# Patient Record
Sex: Female | Born: 1995 | Race: White | Hispanic: No | Marital: Single | Smoking: Former smoker
Health system: Southern US, Community
[De-identification: ages and names within clinical notes are randomized; demographics above are authoritative.]

## PROBLEM LIST (undated history)

## (undated) DIAGNOSIS — F909 Attention-deficit hyperactivity disorder, unspecified type: Secondary | ICD-10-CM

## (undated) DIAGNOSIS — Q600 Renal agenesis, unilateral: Secondary | ICD-10-CM

## (undated) DIAGNOSIS — N289 Disorder of kidney and ureter, unspecified: Secondary | ICD-10-CM

## (undated) DIAGNOSIS — E079 Disorder of thyroid, unspecified: Secondary | ICD-10-CM

## (undated) HISTORY — DX: Renal agenesis, unilateral: Q60.0

## (undated) HISTORY — DX: Attention-deficit hyperactivity disorder, unspecified type: F90.9

---
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
## 2014-04-01 ENCOUNTER — Emergency Department (HOSPITAL_COMMUNITY): Payer: Medicaid Other

## 2014-04-01 ENCOUNTER — Emergency Department (HOSPITAL_COMMUNITY)
Admission: EM | Admit: 2014-04-01 | Discharge: 2014-04-01 | Disposition: A | Discharge status: Home / Self Care | Payer: Medicaid Other | Attending: Emergency Medicine | Admitting: Emergency Medicine

## 2014-04-01 ENCOUNTER — Encounter (HOSPITAL_COMMUNITY): Payer: Self-pay | Admitting: Emergency Medicine

## 2014-04-01 DIAGNOSIS — R1011 Right upper quadrant pain: Secondary | ICD-10-CM | POA: Diagnosis not present

## 2014-04-01 DIAGNOSIS — R109 Unspecified abdominal pain: Secondary | ICD-10-CM | POA: Insufficient documentation

## 2014-04-01 DIAGNOSIS — Z8659 Personal history of other mental and behavioral disorders: Secondary | ICD-10-CM | POA: Diagnosis not present

## 2014-04-01 DIAGNOSIS — N390 Urinary tract infection, site not specified: Secondary | ICD-10-CM | POA: Insufficient documentation

## 2014-04-01 DIAGNOSIS — Z3202 Encounter for pregnancy test, result negative: Secondary | ICD-10-CM | POA: Diagnosis not present

## 2014-04-01 LAB — CBC WITH DIFFERENTIAL/PLATELET
BASOS PCT: 0 % (ref 0–1)
Basophils Absolute: 0 10*3/uL (ref 0.0–0.1)
Eosinophils Absolute: 0.5 10*3/uL (ref 0.0–0.7)
Eosinophils Relative: 5 % (ref 0–5)
HEMATOCRIT: 35.5 % — AB (ref 36.0–46.0)
HEMOGLOBIN: 11.9 g/dL — AB (ref 12.0–15.0)
Lymphocytes Relative: 21 % (ref 12–46)
Lymphs Abs: 1.9 10*3/uL (ref 0.7–4.0)
MCH: 30 pg (ref 26.0–34.0)
MCHC: 33.5 g/dL (ref 30.0–36.0)
MCV: 89.4 fL (ref 78.0–100.0)
MONO ABS: 0.4 10*3/uL (ref 0.1–1.0)
MONOS PCT: 5 % (ref 3–12)
NEUTROS ABS: 6.2 10*3/uL (ref 1.7–7.7)
Neutrophils Relative %: 69 % (ref 43–77)
Platelets: 387 10*3/uL (ref 150–400)
RBC: 3.97 MIL/uL (ref 3.87–5.11)
RDW: 12.9 % (ref 11.5–15.5)
WBC: 8.9 10*3/uL (ref 4.0–10.5)

## 2014-04-01 LAB — URINALYSIS, ROUTINE W REFLEX MICROSCOPIC
Glucose, UA: NEGATIVE mg/dL
Hgb urine dipstick: NEGATIVE
NITRITE: NEGATIVE
PH: 5.5 (ref 5.0–8.0)
PROTEIN: 30 mg/dL — AB
Specific Gravity, Urine: 1.025 (ref 1.005–1.030)
Urobilinogen, UA: 1 mg/dL (ref 0.0–1.0)

## 2014-04-01 LAB — COMPREHENSIVE METABOLIC PANEL
ALBUMIN: 3.5 g/dL (ref 3.5–5.2)
ALT: 17 U/L (ref 0–35)
ANION GAP: 10 (ref 5–15)
AST: 19 U/L (ref 0–37)
Alkaline Phosphatase: 103 U/L (ref 39–117)
BILIRUBIN TOTAL: 0.2 mg/dL — AB (ref 0.3–1.2)
BUN: 13 mg/dL (ref 6–23)
CHLORIDE: 103 meq/L (ref 96–112)
CO2: 27 mEq/L (ref 19–32)
CREATININE: 0.82 mg/dL (ref 0.50–1.10)
Calcium: 9.1 mg/dL (ref 8.4–10.5)
GLUCOSE: 82 mg/dL (ref 70–99)
Potassium: 4 mEq/L (ref 3.7–5.3)
Sodium: 140 mEq/L (ref 137–147)
Total Protein: 7.3 g/dL (ref 6.0–8.3)

## 2014-04-01 LAB — D-DIMER, QUANTITATIVE: D-Dimer, Quant: 3.25 ug/mL-FEU — ABNORMAL HIGH (ref 0.00–0.48)

## 2014-04-01 LAB — PREGNANCY, URINE: Preg Test, Ur: NEGATIVE

## 2014-04-01 LAB — URINE MICROSCOPIC-ADD ON

## 2014-04-01 LAB — LIPASE, BLOOD: Lipase: 16 U/L (ref 11–59)

## 2014-04-01 MED ORDER — HYDROCODONE-ACETAMINOPHEN 5-325 MG PO TABS: 1.0000 | ORAL_TABLET | Freq: Four times a day (QID) | ORAL | Status: DC | PRN

## 2014-04-01 MED ORDER — CEPHALEXIN 500 MG PO CAPS: 500.0000 mg | ORAL_CAPSULE | Freq: Four times a day (QID) | ORAL | Status: DC

## 2014-04-01 MED ORDER — ONDANSETRON HCL 4 MG/2ML IJ SOLN
4.0000 mg | Freq: Once | INTRAMUSCULAR | Status: AC
  Administered 2014-04-01: 4 mg via INTRAVENOUS
  Filled 2014-04-01: qty 2

## 2014-04-01 MED ORDER — RANITIDINE HCL 150 MG PO CAPS: 150.0000 mg | ORAL_CAPSULE | Freq: Every day | ORAL | Status: DC

## 2014-04-01 MED ORDER — HYDROMORPHONE HCL PF 1 MG/ML IJ SOLN
1.0000 mg | Freq: Once | INTRAMUSCULAR | Status: AC
  Administered 2014-04-01: 1 mg via INTRAVENOUS
  Filled 2014-04-01: qty 1

## 2014-04-01 MED ORDER — IOHEXOL 350 MG/ML SOLN
100.0000 mL | Freq: Once | INTRAVENOUS | Status: AC | PRN
  Administered 2014-04-01: 100 mL via INTRAVENOUS

## 2014-04-01 MED ORDER — CEPHALEXIN 500 MG PO CAPS
500.0000 mg | ORAL_CAPSULE | Freq: Once | ORAL | Status: AC
  Administered 2014-04-01: 500 mg via ORAL
  Filled 2014-04-01: qty 1

## 2014-04-01 NOTE — ED Notes (Addendum)
Pain rt flank. Up to rt shoulder , post thighs hurt also.  No NVD, No fever.  No cough  No urinary sx.  "hurts to breathe"  Pt is 1 month post partum. Nl vaginal delivery.

## 2014-04-01 NOTE — ED Notes (Signed)
MD at bedside. 

## 2014-04-01 NOTE — Discharge Instructions (Signed)
Follow up with your md next week for recheck °

## 2014-04-01 NOTE — ED Provider Notes (Signed)
Pt left at change of shift to get ddimer result. It is elevated. Talked to patient about need for CT angio chest.   PT given results of her CT, pt was discharged as planned by Dr Estell Harpin.    Ct Angio Chest W/cm &/or Wo Cm  04/01/2014   CLINICAL DATA:  Right side chest pain, positive D-dimer  EXAM: CT ANGIOGRAPHY CHEST WITH CONTRAST  TECHNIQUE: Multidetector CT imaging of the chest was performed using the standard protocol during bolus administration of intravenous contrast. Multiplanar CT image reconstructions and MIPs were obtained to evaluate the vascular anatomy.  CONTRAST:  OMNIPAQUE IOHEXOL 350 MG/ML SOLN  COMPARISON:  None.  FINDINGS: Sagittal images of the spine are unremarkable. Images of the thoracic inlet are unremarkable. Central airways are patent. No mediastinal hematoma or adenopathy. No aortic aneurysm. No pulmonary embolus is identified. No destructive bony lesions are noted.  Images of the lung parenchyma shows no acute infiltrate or pleural effusion. No pulmonary edema. No pulmonary nodules are noted. There is no pneumothorax.  Visualized upper abdomen is unremarkable.  No axillary adenopathy.  Review of the MIP images confirms the above findings.  IMPRESSION: 1. No pulmonary embolus. 2. No acute infiltrate or pulmonary edema. 3. No mediastinal hematoma or adenopathy.   Electronically Signed   By: Natasha Mead M.D.   On: 04/01/2014 16:49   Plan discharge  Devoria Albe, MD, Franz Dell, MD 04/01/14 [phone]

## 2014-04-01 NOTE — ED Provider Notes (Signed)
CSN: [id number]     Arrival date & time 04/01/14  1348 History  This chart was scribed for Benny Lennert, MD by Carl Best, ED Scribe. This patient was seen in room APA04/APA04 and the patient's care was started at 2:15 PM.    Chief Complaint  Patient presents with  . Flank Pain    Patient is a 18 y.o. female presenting with abdominal pain. The history is provided by the patient. No language interpreter was used.  Abdominal Pain Pain location:  L flank Pain radiates to:  L leg Pain severity:  Moderate Onset quality:  Sudden Duration:  2 days Progression:  Worsening Chronicity:  New Relieved by:  None tried Worsened by:  Nothing tried Ineffective treatments:  None tried Associated symptoms: no chest pain, no cough, no diarrhea, no fatigue and no hematuria   Risk factors: has not had multiple surgeries     Past Medical History  Diagnosis Date  . ADHD (attention deficit hyperactivity disorder) 11/05/2012  . Renal disorder     born with one kidney    History reviewed. No pertinent past surgical history. Family History  Problem Relation Age of Onset  . Hyperlipidemia Father    History  Substance Use Topics  . Smoking status: Former Smoker    Types: Cigarettes  . Smokeless tobacco: Never Used  . Alcohol Use: No   OB History   Grav Para Term Preterm Abortions TAB SAB Ect Mult Living   1              Review of Systems  Constitutional: Negative for appetite change and fatigue.  HENT: Negative for congestion, ear discharge and sinus pressure.   Eyes: Negative for discharge.  Respiratory: Negative for cough.   Cardiovascular: Negative for chest pain.  Gastrointestinal: Negative for abdominal pain and diarrhea.  Genitourinary: Positive for flank pain. Negative for frequency and hematuria.  Musculoskeletal: Positive for arthralgias. Negative for back pain.  Skin: Negative for rash.  Neurological: Negative for seizures and headaches.  Psychiatric/Behavioral: Negative for  hallucinations.      Allergies  Review of patient's allergies indicates no known allergies.  Home Medications   Prior to Admission medications   Medication Sig Start Date End Date Taking? Authorizing Provider  Doxylamine-Pyridoxine (DICLEGIS) 10-10 MG TBEC Take 10 mg by mouth See admin instructions. 07/09/13   Marge Duncans, CNM  metroNIDAZOLE (FLAGYL) 500 MG tablet Take 1 tablet (500 mg total) by mouth 2 (two) times daily. 06/18/13   Jacklyn Shell, CNM  ondansetron (ZOFRAN ODT) 4 MG disintegrating tablet Take 1 tablet (4 mg total) by mouth every 8 (eight) hours as needed for nausea or vomiting. 08/31/13   Ward Givens, MD  oseltamivir (TAMIFLU) 75 MG capsule Take 1 capsule (75 mg total) by mouth every 12 (twelve) hours. 08/31/13   Ward Givens, MD  prenatal vitamin w/FE, FA (PRENATAL 1 + 1) 27-1 MG TABS tablet Take 1 tablet by mouth daily at 12 noon. 07/09/13   Marge Duncans, CNM   Triage Vitals: BP 125/87  Pulse 75  Temp(Src) 98.1 F (36.7 C) (Oral)  Resp 18  Ht 5' (1.524 m)  Wt 165 lb (74.844 kg)  BMI 32.22 kg/m2  SpO2 99%  LMP 03/17/2013  Breastfeeding? Unknown  Physical Exam  Constitutional: She is oriented to person, place, and time. She appears well-developed.  HENT:  Head: Normocephalic.  Eyes: Conjunctivae and EOM are normal. No scleral icterus.  Neck: Neck supple. No thyromegaly present.  Cardiovascular: Normal rate and regular rhythm.  Exam reveals no gallop and no friction rub.   No murmur heard. Pulmonary/Chest: No stridor. She has no wheezes. She has no rales. She exhibits no tenderness.  Abdominal: She exhibits no distension. There is tenderness in the right upper quadrant. There is no rebound.  Musculoskeletal: Normal range of motion. She exhibits no edema.  Lymphadenopathy:    She has no cervical adenopathy.  Neurological: She is oriented to person, place, and time. She exhibits normal muscle tone. Coordination normal.  Skin: No  rash noted. No erythema.  Psychiatric: She has a normal mood and affect. Her behavior is normal.    ED Course  Procedures (including critical care time)  DIAGNOSTIC STUDIES: Oxygen Saturation is 99% on room air, normal by my interpretation.    COORDINATION OF CARE: 2:17 PM- Discussed getting an ultrasound of the patient's gallbladder and the patient agreed to the treatment plan.   Labs Review Labs Reviewed - No data to display  Imaging Review No results found.   EKG Interpretation None      MDM   Final diagnoses:  None    The chart was scribed for me under my direct supervision.  I personally performed the history, physical, and medical decision making and all procedures in the evaluation of this patient.Benny Lennert, MD 04/14/14 [phone]

## 2014-06-02 ENCOUNTER — Encounter (HOSPITAL_COMMUNITY): Payer: Self-pay | Admitting: Emergency Medicine

## 2014-09-04 ENCOUNTER — Ambulatory Visit: Payer: Medicaid Other | Admitting: Pediatrics

## 2014-11-11 ENCOUNTER — Encounter: Payer: Medicaid Other | Admitting: Pediatrics

## 2014-11-13 ENCOUNTER — Ambulatory Visit (HOSPITAL_COMMUNITY): Payer: MEDICAID | Admitting: Psychiatry

## 2014-11-13 ENCOUNTER — Encounter (HOSPITAL_COMMUNITY): Payer: Self-pay | Admitting: Psychiatry

## 2014-11-14 ENCOUNTER — Ambulatory Visit (HOSPITAL_COMMUNITY): Payer: MEDICAID | Admitting: Psychiatry

## 2014-11-27 ENCOUNTER — Ambulatory Visit (INDEPENDENT_AMBULATORY_CARE_PROVIDER_SITE_OTHER): Payer: MEDICAID | Admitting: Psychiatry

## 2014-11-27 ENCOUNTER — Encounter (HOSPITAL_COMMUNITY): Payer: Self-pay | Admitting: Psychiatry

## 2014-11-27 VITALS — BP 105/62 | HR 92 | Ht 60.0 in | Wt 190.0 lb

## 2014-11-27 DIAGNOSIS — F902 Attention-deficit hyperactivity disorder, combined type: Secondary | ICD-10-CM

## 2014-11-27 MED ORDER — METHYLPHENIDATE HCL ER (OSM) 36 MG PO TBCR: 72.0000 mg | EXTENDED_RELEASE_TABLET | ORAL | Status: DC

## 2014-11-27 NOTE — Progress Notes (Signed)
Psychiatric Assessment Adult  Patient Identification:  Sherry Vasquez Date of Evaluation:  11/27/2014 Chief Complaint: "I have ADHD and I've been off my medicine" History of Chief Complaint:   Chief Complaint  Patient presents with  . ADHD  . Establish Care    HPI this patient is a 19 year old married white female who lives with her husband and 6856-month-old [place] in [place]. She works at a [employer].  The patient is self-referred but she had been going to try of medicine and pediatrics in the past for treatment of ADHD.  The patient states that she was diagnosed with ADHD at age 89. She was mildly hyperactive but primarily unfocused and can follow through on things and didn't complete school work. She was on Adderall initially which was helpful throughout elementary school and middle school. In the ninth grade she did try to do without medicine but was hypertalkative impulsive and often got in trouble with teachers and was suspended several times. In the 10th grade she went back on Concerta and was up to a dosage of 72 mg every morning. She continued on this dose throughout high school and did very well. However the end of high school she got pregnant and got married and went off the medication.  The patient would like to get back on medicine because now she can still can't focus. She starts one thing and doesn't finish and goes to another. She's very disorganized and fidgety. She sleeps well and her energy is good. She denies symptoms of depression or anxiety. She has not have any significant medical problems other than she was congenitally born with one kidney. She does not use drugs or alcohol Review of Systems  Psychiatric/Behavioral: Positive for decreased concentration. The patient is hyperactive.   All other systems reviewed and are negative.  Physical Exam not done  Depressive Symptoms: difficulty concentrating,  (Hypo) Manic Symptoms:   Elevated Mood:  No Irritable Mood:   Yes Grandiosity:  No Distractibility:  Yes Labiality of Mood:  No Delusions:  No Hallucinations:  No Impulsivity:  No Sexually Inappropriate Behavior:  No Financial Extravagance:  No Flight of Ideas:  No  Anxiety Symptoms: Excessive Worry:  No Panic Symptoms:  No Agoraphobia:  No Obsessive Compulsive: No  Symptoms: None, Specific Phobias:  No Social Anxiety:  No  Psychotic Symptoms:  Hallucinations: No None Delusions:  No Paranoia:  No   Ideas of Reference:  No  PTSD Symptoms: Ever had a traumatic exposure:  No Had a traumatic exposure in the last month:  No Re-experiencing: No None Hypervigilance:  No Hyperarousal: No None Avoidance: No None  Traumatic Brain Injury: No  Past Psychiatric History: Diagnosis: ADHD   Hospitalizations: none  Outpatient Care: Through pediatrics   Substance Abuse Care: none  Self-Mutilation: none  Suicidal Attempts: none  Violent Behaviors: none   Past Medical History:   Past Medical History  Diagnosis Date  . ADHD (attention deficit hyperactivity disorder) 11/05/2012  . Renal disorder     born with one kidney   . Congenital absence of one kidney    History of Loss of Consciousness:  No Seizure History:  No Cardiac History:  No Allergies:  No Known Allergies Current Medications:  Current Outpatient Prescriptions  Medication Sig Dispense Refill  . Norgestim-Eth Estrad Triphasic (NORGESTIMATE-ETHINYL ESTRADIOL PO) Take 1 tablet by mouth daily. 0.25mg /0.035mg     . methylphenidate (CONCERTA) 36 MG PO CR tablet Take 2 tablets (72 mg total) by mouth every morning. 60 tablet  0  . methylphenidate (CONCERTA) 36 MG PO CR tablet Take 2 tablets (72 mg total) by mouth every morning. 60 tablet 0   No current facility-administered medications for this visit.    Previous Psychotropic Medications:  Medication Dose   Adderall, Concerta                        Substance Abuse History in the last 12 months: Substance Age of 1st Use  Last Use Amount Specific Type  Nicotine      Alcohol      Cannabis      Opiates      Cocaine      Methamphetamines      LSD      Ecstasy      Benzodiazepines      Caffeine      Inhalants      Others:                          Medical Consequences of Substance Abuse: none  Legal Consequences of Substance Abuse: none  Family Consequences of Substance Abuse: none  Blackouts:  No DT's:  No Withdrawal Symptoms:  No None  Social History: Current Place of Residence: [address] of Birth: [place] Family Members: Husband and daughter, 2 brothers 2 sisters and father Marital Status:  Married Children:   Sons:   Daughters: 1 Relationships:  Education:  HS Print production planner Problems/Performance: Did not do well in high school off medications Religious Beliefs/Practices: Christian History of Abuse: none Armed forces technical officer; Surveyor, quantity History:  None. Legal History: none Hobbies/Interests: Spending time with husband and daughter  Family History:   Family History  Problem Relation Age of Onset  . Hyperlipidemia Father   . ADD / ADHD Brother   . ADD / ADHD Sister   . ADD / ADHD Cousin   . Drug abuse Mother     Mental Status Examination/Evaluation: Objective:  Appearance: Casual and Fairly Groomed  Eye Contact::  Good  Speech:  Clear and Coherent  Volume:  Normal  Mood:  Good   Affect:  Congruent  Thought Process:  Goal Directed  Orientation:  Full (Time, Place, and Person)  Thought Content:  WDL  Suicidal Thoughts:  No  Homicidal Thoughts:  No  Judgement:  Good  Insight:  Good  Psychomotor Activity:  Normal  Akathisia:  No  Handed:  Right  AIMS (if indicated):    Assets:  Communication Skills Desire for Improvement Physical Health Resilience Social Support Talents/Skills    Laboratory/X-Ray Psychological Evaluation(s)   Last basic metabolic panel is normal      Assessment:  Axis I: ADHD, combined type  AXIS I ADHD,  combined type  AXIS II Deferred  AXIS III Past Medical History  Diagnosis Date  . ADHD (attention deficit hyperactivity disorder) 11/05/2012  . Renal disorder     born with one kidney   . Congenital absence of one kidney      AXIS IV other psychosocial or environmental problems  AXIS V 51-60 moderate symptoms   Treatment Plan/Recommendations:  Plan of Care: Medication management   Laboratory:  Psychotherapy: Not needed at this time   Medications: She'll restart Concerta 72 mg every morning   Routine PRN Medications:  No  Consultations:   Safety Concerns:  She denies thoughts of hurting self or others   Other: She'll return in 2 months      ROSS,  Gavin Pound, MD 4/28/20161:21 PM

## 2015-01-05 ENCOUNTER — Encounter (HOSPITAL_COMMUNITY): Payer: Self-pay | Admitting: *Deleted

## 2015-01-05 ENCOUNTER — Emergency Department (HOSPITAL_COMMUNITY)
Admission: EM | Admit: 2015-01-05 | Discharge: 2015-01-06 | Disposition: A | Discharge status: Home / Self Care | Payer: Medicaid Other | Attending: Emergency Medicine | Admitting: Emergency Medicine

## 2015-01-05 DIAGNOSIS — T148XXA Other injury of unspecified body region, initial encounter: Secondary | ICD-10-CM

## 2015-01-05 DIAGNOSIS — Q600 Renal agenesis, unilateral: Secondary | ICD-10-CM | POA: Insufficient documentation

## 2015-01-05 DIAGNOSIS — Z87891 Personal history of nicotine dependence: Secondary | ICD-10-CM | POA: Insufficient documentation

## 2015-01-05 DIAGNOSIS — Y929 Unspecified place or not applicable: Secondary | ICD-10-CM | POA: Diagnosis not present

## 2015-01-05 DIAGNOSIS — S39011A Strain of muscle, fascia and tendon of abdomen, initial encounter: Secondary | ICD-10-CM | POA: Insufficient documentation

## 2015-01-05 DIAGNOSIS — X58XXXA Exposure to other specified factors, initial encounter: Secondary | ICD-10-CM | POA: Diagnosis not present

## 2015-01-05 DIAGNOSIS — Z79899 Other long term (current) drug therapy: Secondary | ICD-10-CM | POA: Diagnosis not present

## 2015-01-05 DIAGNOSIS — F909 Attention-deficit hyperactivity disorder, unspecified type: Secondary | ICD-10-CM | POA: Insufficient documentation

## 2015-01-05 DIAGNOSIS — R1011 Right upper quadrant pain: Secondary | ICD-10-CM | POA: Diagnosis present

## 2015-01-05 DIAGNOSIS — Y939 Activity, unspecified: Secondary | ICD-10-CM | POA: Diagnosis not present

## 2015-01-05 DIAGNOSIS — Z793 Long term (current) use of hormonal contraceptives: Secondary | ICD-10-CM | POA: Diagnosis not present

## 2015-01-05 DIAGNOSIS — Z3202 Encounter for pregnancy test, result negative: Secondary | ICD-10-CM | POA: Insufficient documentation

## 2015-01-05 DIAGNOSIS — Y999 Unspecified external cause status: Secondary | ICD-10-CM | POA: Insufficient documentation

## 2015-01-05 LAB — COMPREHENSIVE METABOLIC PANEL
ALT: 19 U/L (ref 14–54)
ANION GAP: 10 (ref 5–15)
AST: 17 U/L (ref 15–41)
Albumin: 3.9 g/dL (ref 3.5–5.0)
Alkaline Phosphatase: 108 U/L (ref 38–126)
BILIRUBIN TOTAL: 0.2 mg/dL — AB (ref 0.3–1.2)
BUN: 10 mg/dL (ref 6–20)
CHLORIDE: 102 mmol/L (ref 101–111)
CO2: 28 mmol/L (ref 22–32)
Calcium: 9.3 mg/dL (ref 8.9–10.3)
Creatinine, Ser: 0.88 mg/dL (ref 0.44–1.00)
GFR calc non Af Amer: >60 mL/min (ref >60)
Glucose, Bld: 79 mg/dL (ref 65–99)
POTASSIUM: 3.4 mmol/L — AB (ref 3.5–5.1)
Sodium: 140 mmol/L (ref 135–145)
TOTAL PROTEIN: 8 g/dL (ref 6.5–8.1)

## 2015-01-05 LAB — CBC WITH DIFFERENTIAL/PLATELET
Basophils Absolute: 0.1 10*3/uL (ref 0.0–0.1)
Basophils Relative: 1 % (ref 0–1)
EOS ABS: 1 10*3/uL — AB (ref 0.0–0.7)
EOS PCT: 10 % — AB (ref 0–5)
HCT: 41.3 % (ref 36.0–46.0)
HEMOGLOBIN: 13.8 g/dL (ref 12.0–15.0)
LYMPHS PCT: 37 % (ref 12–46)
Lymphs Abs: 3.7 10*3/uL (ref 0.7–4.0)
MCH: 29.6 pg (ref 26.0–34.0)
MCHC: 33.4 g/dL (ref 30.0–36.0)
MCV: 88.4 fL (ref 78.0–100.0)
MONOS PCT: 6 % (ref 3–12)
Monocytes Absolute: 0.6 10*3/uL (ref 0.1–1.0)
NEUTROS ABS: 4.7 10*3/uL (ref 1.7–7.7)
Neutrophils Relative %: 46 % (ref 43–77)
Platelets: 375 10*3/uL (ref 150–400)
RBC: 4.67 MIL/uL (ref 3.87–5.11)
RDW: 13 % (ref 11.5–15.5)
WBC: 10 10*3/uL (ref 4.0–10.5)

## 2015-01-05 LAB — LIPASE, BLOOD: Lipase: 16 U/L — ABNORMAL LOW (ref 22–51)

## 2015-01-05 MED ORDER — FENTANYL CITRATE (PF) 100 MCG/2ML IJ SOLN
50.0000 ug | Freq: Once | INTRAMUSCULAR | Status: AC
  Administered 2015-01-05: 50 ug via INTRAVENOUS
  Filled 2015-01-05: qty 2

## 2015-01-05 MED ORDER — ONDANSETRON HCL 4 MG/2ML IJ SOLN
4.0000 mg | Freq: Once | INTRAMUSCULAR | Status: AC
  Administered 2015-01-05: 4 mg via INTRAVENOUS
  Filled 2015-01-05: qty 2

## 2015-01-05 NOTE — ED Notes (Signed)
Pt c/o right sided pain that started earlier today; pt denies any n/v or any difficulty with urination; last BM was yesterday

## 2015-01-05 NOTE — ED Provider Notes (Signed)
CSN: [id number]     Arrival date & time 01/05/15  2200 History   First MD Initiated Contact with Patient 01/05/15 2227     Chief Complaint  Patient presents with  . Abdominal Pain     (Consider location/radiation/quality/duration/timing/severity/associated sxs/prior Treatment) Patient is a 19 y.o. female presenting with abdominal pain. The history is provided by the patient.  Abdominal Pain Pain location:  RUQ Pain quality: stabbing   Pain radiates to:  Does not radiate Pain severity:  Moderate Duration:  9 hours Timing:  Intermittent Progression:  Worsening Chronicity:  New Context comment:  Pain started after moving objects into appartment. Relieved by:  Nothing Exacerbated by: lying flat or raising arms. Ineffective treatments:  None tried Associated symptoms: no chills, no dysuria, no fever, no nausea, no shortness of breath, no vaginal discharge and no vomiting   Risk factors: no alcohol abuse     Past Medical History  Diagnosis Date  . ADHD (attention deficit hyperactivity disorder) 11/05/2012  . Renal disorder     born with one kidney   . Congenital absence of one kidney    History reviewed. No pertinent past surgical history. Family History  Problem Relation Age of Onset  . Hyperlipidemia Father   . ADD / ADHD Brother   . ADD / ADHD Sister   . ADD / ADHD Cousin   . Drug abuse Mother    History  Substance Use Topics  . Smoking status: Former Smoker    Types: Cigarettes  . Smokeless tobacco: Never Used  . Alcohol Use: No   OB History    Gravida Para Term Preterm AB TAB SAB Ectopic Multiple Living   1              Review of Systems  Constitutional: Negative for fever and chills.  Respiratory: Negative for shortness of breath.   Gastrointestinal: Positive for abdominal pain. Negative for nausea and vomiting.  Genitourinary: Negative for dysuria and vaginal discharge.  All other systems reviewed and are negative.     Allergies  Review of patient's  allergies indicates no known allergies.  Home Medications   Prior to Admission medications   Medication Sig Start Date End Date Taking? Authorizing Provider  methylphenidate (CONCERTA) 36 MG PO CR tablet Take 2 tablets (72 mg total) by mouth every morning. 11/27/14 11/27/15  Myrlene Brokereborah R Ross, MD  methylphenidate (CONCERTA) 36 MG PO CR tablet Take 2 tablets (72 mg total) by mouth every morning. 11/27/14 11/27/15  Myrlene Brokereborah R Ross, MD  Norgestim-Eth Charlott HollerEstrad Triphasic (NORGESTIMATE-ETHINYL ESTRADIOL PO) Take 1 tablet by mouth daily. 0.25mg /0.035mg     Historical Provider, MD   BP 120/80 mmHg  Pulse 93  Temp(Src) 98.8 F (37.1 C) (Oral)  Resp 20  Ht 5' (1.524 m)  Wt 165 lb (74.844 kg)  BMI 32.22 kg/m2  SpO2 100%  LMP 01/01/2015 Physical Exam  Constitutional: She is oriented to person, place, and time. She appears well-developed and well-nourished.  Non-toxic appearance.  HENT:  Head: Normocephalic.  Right Ear: Tympanic membrane and external ear normal.  Left Ear: Tympanic membrane and external ear normal.  Eyes: EOM and lids are normal. Pupils are equal, round, and reactive to light.  Neck: Normal range of motion. Neck supple. Carotid bruit is not present.  Cardiovascular: Normal rate, regular rhythm, normal heart sounds, intact distal pulses and normal pulses.   Pulmonary/Chest: Breath sounds normal. No respiratory distress.  Abdominal: Soft. Bowel sounds are normal. There is no tenderness. There is no  guarding.  No CVAT  Musculoskeletal: Normal range of motion. She exhibits no edema or tenderness.  Lymphadenopathy:       Head (right side): No submandibular adenopathy present.       Head (left side): No submandibular adenopathy present.    She has no cervical adenopathy.  Neurological: She is alert and oriented to person, place, and time. She has normal strength. No cranial nerve deficit or sensory deficit.  Skin: Skin is warm and dry.  Psychiatric: She has a normal mood and affect. Her  speech is normal.  Nursing note and vitals reviewed.   ED Course  Procedures (including critical care time) Labs Review Labs Reviewed  CBC WITH DIFFERENTIAL/PLATELET - Abnormal; Notable for the following:    Eosinophils Relative 10 (*)    Eosinophils Absolute 1.0 (*)    All other components within normal limits  COMPREHENSIVE METABOLIC PANEL  LIPASE, BLOOD  URINALYSIS, ROUTINE W REFLEX MICROSCOPIC (NOT AT [facility])  PREGNANCY, URINE    Imaging Review No results found.   EKG Interpretation None      MDM  Vital signs wnl. Pulse ox 100% on room air. Urine non-acute  cmet wnl. Lipase low at 16. CBC wnl.  Exam is c/w muscle strain. Rx for robaxin and motrin given to the patient. Pt to follow up with PCP for additional evaluation and management.   Final diagnoses:  None    *I have reviewed nursing notes, vital signs, and all appropriate lab and imaging results for this patient.**    Ivery Quale, PA-C 01/09/15 0147  Samuel Jester, DO 01/10/15 1139

## 2015-01-06 LAB — URINALYSIS, ROUTINE W REFLEX MICROSCOPIC
BILIRUBIN URINE: NEGATIVE
Glucose, UA: NEGATIVE mg/dL
Ketones, ur: NEGATIVE mg/dL
Leukocytes, UA: NEGATIVE
Nitrite: NEGATIVE
PH: 6 (ref 5.0–8.0)
Protein, ur: NEGATIVE mg/dL
Urobilinogen, UA: 0.2 mg/dL (ref 0.0–1.0)

## 2015-01-06 LAB — URINE MICROSCOPIC-ADD ON

## 2015-01-06 LAB — PREGNANCY, URINE: Preg Test, Ur: NEGATIVE

## 2015-01-06 MED ORDER — METHOCARBAMOL 500 MG PO TABS: 500.0000 mg | ORAL_TABLET | Freq: Three times a day (TID) | ORAL | Status: DC

## 2015-01-06 MED ORDER — IBUPROFEN 800 MG PO TABS: 800.0000 mg | ORAL_TABLET | Freq: Three times a day (TID) | ORAL | Status: DC

## 2015-01-06 NOTE — Discharge Instructions (Signed)
Your test are negative for acute problem. Your exam suggest muscle strain. Please use heating pad to the affected area. Use  Robaxin and ibuprofen for pain. Robaxin may cause drowsiness, use with caution. See Dr Debbora PrestoFlippo for follow up and management. Muscle Strain A muscle strain (pulled muscle) happens when a muscle is stretched beyond normal length. It happens when a sudden, violent force stretches your muscle too far. Usually, a few of the fibers in your muscle are torn. Muscle strain is common in athletes. Recovery usually takes 1-2 weeks. Complete healing takes 5-6 weeks.  HOME CARE   Follow the PRICE method of treatment to help your injury get better. Do this the first 2-3 days after the injury:  Protect. Protect the muscle to keep it from getting injured again.  Rest. Limit your activity and rest the injured body part.  Ice. Put ice in a plastic bag. Place a towel between your skin and the bag. Then, apply the ice and leave it on from 15-20 minutes each hour. After the third day, switch to moist heat packs.  Compression. Use a splint or elastic bandage on the injured area for comfort. Do not put it on too tightly.  Elevate. Keep the injured body part above the level of your heart.  Only take medicine as told by your doctor.  Warm up before doing exercise to prevent future muscle strains. GET HELP IF:   You have more pain or puffiness (swelling) in the injured area.  You feel numbness, tingling, or notice a loss of strength in the injured area. MAKE SURE YOU:   Understand these instructions.  Will watch your condition.  Will get help right away if you are not doing well or get worse. Document Released: 04/26/2008 Document Revised: 05/08/2013 Document Reviewed: 02/14/2013 [facility] Patient Information 2015 [employer] [place]. This information is not intended to replace advice given to you by your health care provider. Make sure you discuss any questions you have with your health care  provider.

## 2015-01-27 ENCOUNTER — Encounter (HOSPITAL_COMMUNITY): Payer: Self-pay | Admitting: Psychiatry

## 2015-01-27 ENCOUNTER — Ambulatory Visit (INDEPENDENT_AMBULATORY_CARE_PROVIDER_SITE_OTHER): Payer: MEDICAID | Admitting: Psychiatry

## 2015-01-27 DIAGNOSIS — F902 Attention-deficit hyperactivity disorder, combined type: Secondary | ICD-10-CM | POA: Diagnosis not present

## 2015-01-27 MED ORDER — LISDEXAMFETAMINE DIMESYLATE 70 MG PO CAPS: 70.0000 mg | ORAL_CAPSULE | Freq: Every day | ORAL | Status: DC

## 2015-01-27 NOTE — Progress Notes (Signed)
Patient ID: Sherry Vasquez, female   DOB: Jun 07, 1996, 19 y.o.   MRN: [id number]  Psychiatric Assessment Adult  Patient Identification:  Sherry Vasquez Date of Evaluation:  01/27/2015 Chief Complaint: The Concerta is not working as well History of Chief Complaint:   Chief Complaint  Patient presents with  . ADHD  . Follow-up    HPI this patient is a 19 year old married white female who lives with her husband and 71-month-old daughter in [place]. 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  The patient states that she was diagnosed with ADHD at age 74. She was mildly hyperactive but primarily unfocused and can follow through on things and didn't complete school work. She was on Adderall initially which was helpful throughout elementary school and middle school. In the ninth grade she did try to do without medicine but was hypertalkative impulsive and often got in trouble with teachers and was suspended several times. In the 10th grade she went back on Concerta and was up to a dosage of 72 mg every morning. She continued on this dose throughout high school and did very well. However the end of high school she got pregnant and got married and went off the medication.  The patient would like to get back on medicine because now she can still can't focus. She starts one thing and doesn't finish and goes to another. She's very disorganized and fidgety. She sleeps well and her energy is good. She denies symptoms of depression or anxiety. She has not have any significant medical problems other than she was congenitally born with one kidney. She does not use drugs or alcohol  The patient returns after 2 months. She still is on Concerta 72 mg every morning. 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 It wears off about 7 hours  into the shift. I told her we could try Vyvanse which is a longest acting medicine for ADHD and she agrees. She has tried Adderall before but I explained that this would require her to take second dose during the day because it's more short acting Review of Systems  Psychiatric/Behavioral: Positive for decreased concentration. The patient is hyperactive.   All other systems reviewed and are negative.  Physical Exam not done  Depressive Symptoms: difficulty concentrating,  (Hypo) Manic Symptoms:   Elevated Mood:  No Irritable Mood:  Yes Grandiosity:  No Distractibility:  Yes Labiality of Mood:  No Delusions:  No Hallucinations:  No Impulsivity:  No Sexually Inappropriate Behavior:  No Financial Extravagance:  No Flight of Ideas:  No  Anxiety Symptoms: Excessive Worry:  No Panic Symptoms:  No Agoraphobia:  No Obsessive Compulsive: No  Symptoms: None, Specific Phobias:  No Social Anxiety:  No  Psychotic Symptoms:  Hallucinations: No None Delusions:  No Paranoia:  No   Ideas of Reference:  No  PTSD Symptoms: Ever had a traumatic exposure:  No Had a traumatic exposure in the last month:  No Re-experiencing: No None Hypervigilance:  No Hyperarousal: No None Avoidance: No None  Traumatic Brain Injury: No  Past Psychiatric History: Diagnosis: ADHD   Hospitalizations: none  Outpatient Care: Through pediatrics   Substance Abuse Care: none  Self-Mutilation: none  Suicidal Attempts: none  Violent Behaviors: none   Past Medical History:  Past Medical History  Diagnosis Date  . ADHD (attention deficit hyperactivity disorder) 11/05/2012  . Renal disorder     born with one kidney   . Congenital absence of one kidney    History of Loss of Consciousness:  No Seizure History:  No Cardiac History:  No Allergies:  No Known Allergies Current Medications:  Current Outpatient Prescriptions  Medication Sig Dispense Refill  . ibuprofen (ADVIL,MOTRIN) 800 MG tablet Take 1  tablet (800 mg total) by mouth 3 (three) times daily. (Patient taking differently: Take 800 mg by mouth 3 (three) times daily as needed. ) 21 tablet 0  . methocarbamol (ROBAXIN) 500 MG tablet Take 1 tablet (500 mg total) by mouth 3 (three) times daily. (Patient taking differently: Take 500 mg by mouth 3 (three) times daily as needed. ) 21 tablet 0  . norgestimate-ethinyl estradiol (ORTHO-CYCLEN,SPRINTEC,PREVIFEM) 0.25-35 MG-MCG tablet Take 1 tablet by mouth daily.  12  . lisdexamfetamine (VYVANSE) 70 MG capsule Take 1 capsule (70 mg total) by mouth daily. 30 capsule 0  . lisdexamfetamine (VYVANSE) 70 MG capsule Take 1 capsule (70 mg total) by mouth daily. 30 capsule 0   No current facility-administered medications for this visit.    Previous Psychotropic Medications:  Medication Dose   Adderall, Concerta                        Substance Abuse History in the last 12 months: Substance Age of 1st Use Last Use Amount Specific Type  Nicotine      Alcohol      Cannabis      Opiates      Cocaine      Methamphetamines      LSD      Ecstasy      Benzodiazepines      Caffeine      Inhalants      Others:                          Medical Consequences of Substance Abuse: none  Legal Consequences of Substance Abuse: none  Family Consequences of Substance Abuse: none  Blackouts:  No DT's:  No Withdrawal Symptoms:  No None  Social History: Current Place of Residence: [address] of Birth: [place] Family Members: Husband and daughter, 2 brothers 2 sisters and father Marital Status:  Married Children:   Sons:   Daughters: 1 Relationships:  Education:  HS Print production planner Problems/Performance: Did not do well in high school off medications Religious Beliefs/Practices: Christian History of Abuse: none Armed forces technical officer; Surveyor, quantity History:  None. Legal History: none Hobbies/Interests: Spending time with husband and daughter  Family  History:   Family History  Problem Relation Age of Onset  . Hyperlipidemia Father   . ADD / ADHD Brother   . ADD / ADHD Sister   . ADD / ADHD Cousin   . Drug abuse Mother     Mental Status Examination/Evaluation: Objective:  Appearance: Casual and Fairly Groomed  Eye Contact::  Good  Speech:  Clear and Coherent  Volume:  Normal  Mood:  Good   Affect:  Congruent  Thought Process:  Goal Directed  Orientation:  Full (Time, Place, and Person)  Thought Content:  WDL  Suicidal Thoughts:  No  Homicidal Thoughts:  No  Judgement:  Good  Insight:  Good  Psychomotor Activity:  Normal  Akathisia:  No  Handed:  Right  AIMS (if indicated):  Assets:  Communication Skills Desire for Improvement Physical Health Resilience Social Support Talents/Skills    Laboratory/X-Ray Psychological Evaluation(s)   Last basic metabolic panel is normal      Assessment:  Axis I: ADHD, combined type  AXIS I ADHD, combined type  AXIS II Deferred  AXIS III Past Medical History  Diagnosis Date  . ADHD (attention deficit hyperactivity disorder) 11/05/2012  . Renal disorder     born with one kidney   . Congenital absence of one kidney      AXIS IV other psychosocial or environmental problems  AXIS V 51-60 moderate symptoms   Treatment Plan/Recommendations:  Plan of Care: Medication management   Laboratory:  Psychotherapy: Not needed at this time   Medications: She'll discontinue Concerta and start Vyvanse 70 mg every morning   Routine PRN Medications:  No  Consultations:   Safety Concerns:  She denies thoughts of hurting self or others   Other: She'll return in 2 months      Diannia RuderOSS, DEBORAH, MD 6/28/20161:20 PM

## 2015-03-27 ENCOUNTER — Encounter (HOSPITAL_COMMUNITY): Payer: Self-pay | Admitting: Psychiatry

## 2015-03-27 ENCOUNTER — Ambulatory Visit (INDEPENDENT_AMBULATORY_CARE_PROVIDER_SITE_OTHER): Payer: Medicaid Other | Admitting: Psychiatry

## 2015-03-27 VITALS — BP 117/58 | HR 89 | Ht 60.0 in | Wt 189.0 lb

## 2015-03-27 DIAGNOSIS — F902 Attention-deficit hyperactivity disorder, combined type: Secondary | ICD-10-CM | POA: Diagnosis not present

## 2015-03-27 MED ORDER — LISDEXAMFETAMINE DIMESYLATE 70 MG PO CAPS: 70.0000 mg | ORAL_CAPSULE | Freq: Every day | ORAL | Status: DC

## 2015-03-27 NOTE — Progress Notes (Signed)
Patient ID: Sherry Vasquez, female   DOB: May 15, 1996, 19 y.o.   MRN: [id number] Patient ID: Sherry Vasquez, female   DOB: 1995-09-11, 19 y.o.   MRN: [id number]  Psychiatric Assessment Adult  Patient Identification:  Sherry Vasquez Date of Evaluation:  03/27/2015 Chief Complaint: The Vyvanse is working well History of Chief Complaint:   Chief Complaint  Patient presents with  . ADD  . Follow-up    HPI this patient is a 19 year old married white female who lives with her husband and 39-month-old daughter in [place]. She works as a Diplomatic Services operational officer for a [employer]  The patient is self-referred but she had been going to try of medicine and pediatrics in the past for treatment of ADHD.  The patient states that she was diagnosed with ADHD at age 54. She was mildly hyperactive but primarily unfocused and can follow through on things and didn't complete school work. She was on Adderall initially which was helpful throughout elementary school and middle school. In the ninth grade she did try to do without medicine but was hypertalkative impulsive and often got in trouble with teachers and was suspended several times. In the 10th grade she went back on Concerta and was up to a dosage of 72 mg every morning. She continued on this dose throughout high school and did very well. However the end of high school she got pregnant and got married and went off the medication.  The patient would like to get back on medicine because now she can still can't focus. She starts one thing and doesn't finish and goes to another. She's very disorganized and fidgety. She sleeps well and her energy is good. She denies symptoms of depression or anxiety. She has not have any significant medical problems other than she was congenitally born with one kidney. She does not use drugs or alcohol  The patient returns after 2 months. She she now has a new job working as a Diplomatic Services operational officer for [employer]. She left her new job and works  days and has all her weekends off. She seems a lot more relaxed. The Vyvanse is working well to keep her focused and on task. She is eating and sleeping well and denies any side effects Review of Systems  Psychiatric/Behavioral: Positive for decreased concentration. The patient is hyperactive.   All other systems reviewed and are negative.  Physical Exam not done  Depressive Symptoms: difficulty concentrating,  (Hypo) Manic Symptoms:   Elevated Mood:  No Irritable Mood:  Yes Grandiosity:  No Distractibility:  Yes Labiality of Mood:  No Delusions:  No Hallucinations:  No Impulsivity:  No Sexually Inappropriate Behavior:  No Financial Extravagance:  No Flight of Ideas:  No  Anxiety Symptoms: Excessive Worry:  No Panic Symptoms:  No Agoraphobia:  No Obsessive Compulsive: No  Symptoms: None, Specific Phobias:  No Social Anxiety:  No  Psychotic Symptoms:  Hallucinations: No None Delusions:  No Paranoia:  No   Ideas of Reference:  No  PTSD Symptoms: Ever had a traumatic exposure:  No Had a traumatic exposure in the last month:  No Re-experiencing: No None Hypervigilance:  No Hyperarousal: No None Avoidance: No None  Traumatic Brain Injury: No  Past Psychiatric History: Diagnosis: ADHD   Hospitalizations: none  Outpatient Care: Through pediatrics   Substance Abuse Care: none  Self-Mutilation: none  Suicidal Attempts: none  Violent Behaviors: none   Past Medical History:   Past Medical History  Diagnosis Date  . ADHD (attention deficit  hyperactivity disorder) 11/05/2012  . Renal disorder     born with one kidney   . Congenital absence of one kidney    History of Loss of Consciousness:  No Seizure History:  No Cardiac History:  No Allergies:  No Known Allergies Current Medications:  Current Outpatient Prescriptions  Medication Sig Dispense Refill  . ibuprofen (ADVIL,MOTRIN) 800 MG tablet Take 1 tablet (800 mg total) by mouth 3 (three) times daily. (Patient  taking differently: Take 800 mg by mouth 3 (three) times daily as needed. ) 21 tablet 0  . lisdexamfetamine (VYVANSE) 70 MG capsule Take 1 capsule (70 mg total) by mouth daily. 30 capsule 0  . lisdexamfetamine (VYVANSE) 70 MG capsule Take 1 capsule (70 mg total) by mouth daily. 30 capsule 0  . lisdexamfetamine (VYVANSE) 70 MG capsule Take 1 capsule (70 mg total) by mouth daily. 30 capsule 0  . methocarbamol (ROBAXIN) 500 MG tablet Take 1 tablet (500 mg total) by mouth 3 (three) times daily. (Patient taking differently: Take 500 mg by mouth 3 (three) times daily as needed. ) 21 tablet 0  . norgestimate-ethinyl estradiol (ORTHO-CYCLEN,SPRINTEC,PREVIFEM) 0.25-35 MG-MCG tablet Take 1 tablet by mouth daily.  12   No current facility-administered medications for this visit.    Previous Psychotropic Medications:  Medication Dose   Adderall, Concerta                        Substance Abuse History in the last 12 months: Substance Age of 1st Use Last Use Amount Specific Type  Nicotine      Alcohol      Cannabis      Opiates      Cocaine      Methamphetamines      LSD      Ecstasy      Benzodiazepines      Caffeine      Inhalants      Others:                          Medical Consequences of Substance Abuse: none  Legal Consequences of Substance Abuse: none  Family Consequences of Substance Abuse: none  Blackouts:  No DT's:  No Withdrawal Symptoms:  No None  Social History: Current Place of Residence: [address] of Birth: [place] Family Members: Husband and daughter, 2 brothers 2 sisters and father Marital Status:  Married Children:   Sons:   Daughters: 1 Relationships:  Education:  HS Print production planner Problems/Performance: Did not do well in high school off medications Religious Beliefs/Practices: Christian History of Abuse: none Armed forces technical officer; Surveyor, quantity History:  None. Legal History: none Hobbies/Interests: Spending time  with husband and daughter  Family History:   Family History  Problem Relation Age of Onset  . Hyperlipidemia Father   . ADD / ADHD Brother   . ADD / ADHD Sister   . ADD / ADHD Cousin   . Drug abuse Mother     Mental Status Examination/Evaluation: Objective:  Appearance: Casual and Fairly Groomed  Eye Contact::  Good  Speech:  Clear and Coherent  Volume:  Normal  Mood:  Good   Affect:  Congruent  Thought Process:  Goal Directed  Orientation:  Full (Time, Place, and Person)  Thought Content:  WDL  Suicidal Thoughts:  No  Homicidal Thoughts:  No  Judgement:  Good  Insight:  Good  Psychomotor Activity:  Normal  Akathisia:  No  Handed:  Right  AIMS (if indicated):    Assets:  Communication Skills Desire for Improvement Physical Health Resilience Social Support Talents/Skills    Laboratory/X-Ray Psychological Evaluation(s)   Last basic metabolic panel is normal      Assessment:  Axis I: ADHD, combined type  AXIS I ADHD, combined type  AXIS II Deferred  AXIS III Past Medical History  Diagnosis Date  . ADHD (attention deficit hyperactivity disorder) 11/05/2012  . Renal disorder     born with one kidney   . Congenital absence of one kidney      AXIS IV other psychosocial or environmental problems  AXIS V 51-60 moderate symptoms   Treatment Plan/Recommendations:  Plan of Care: Medication management   Laboratory:  Psychotherapy: Not needed at this time   Medications: She'll continue Vyvanse 70 mg every morning for ADD   Routine PRN Medications:  No  Consultations:   Safety Concerns:  She denies thoughts of hurting self or others   Other: She'll return in 3 months      Diannia Ruder, MD 8/26/20162:32 PM

## 2015-04-14 IMAGING — CT CT ANGIO CHEST
2 of 4 series · 8 of 36 positions shown · IV contrast (Omnipaque 300)
Comparison: None.

CLINICAL DATA: Right side chest pain, positive D-dimer

EXAM:
CT ANGIOGRAPHY CHEST WITH CONTRAST
TECHNIQUE: Multidetector CT imaging of the chest was performed using the
standard protocol during bolus administration of intravenous
contrast. Multiplanar CT image reconstructions and MIPs were
obtained to evaluate the vascular anatomy.
CONTRAST:  100mL OMNIPAQUE IOHEXOL 350 MG/ML SOLN

[Series 4: pe 3.0 b40f · axial · 0.67mm/px · z∈[-192,-60]mm · 5 of 66 slices shown]
[im 11/66  lung]
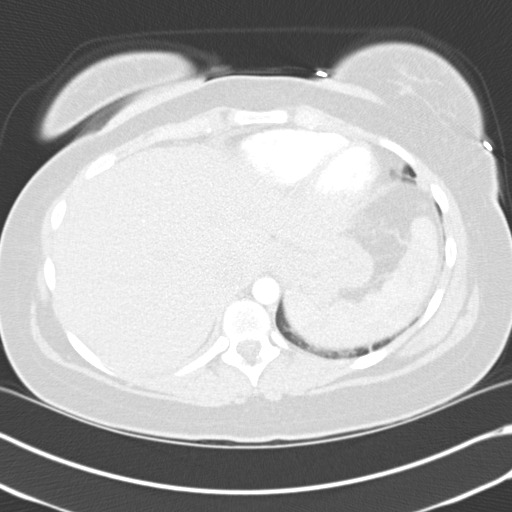
[im 22/66  mediastinal]
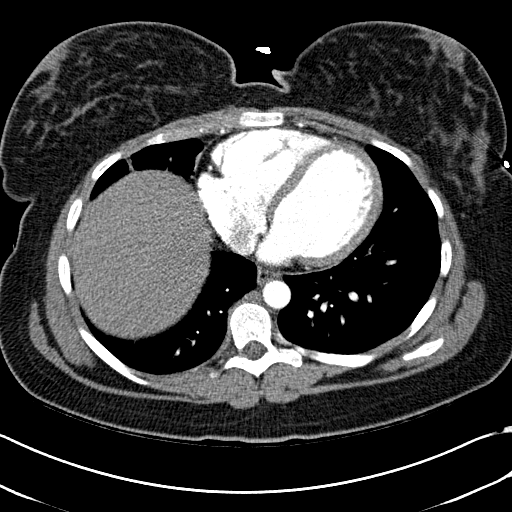
[im 33/66  lung]
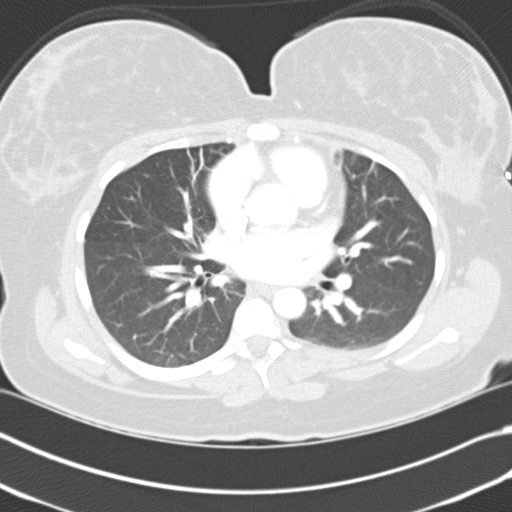
[im 44/66  mediastinal]
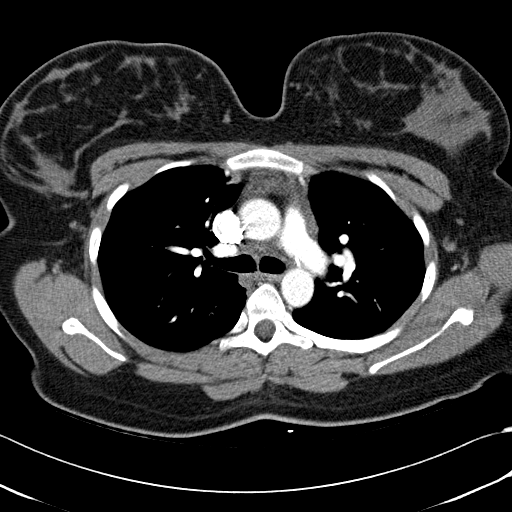
[im 55/66  lung]
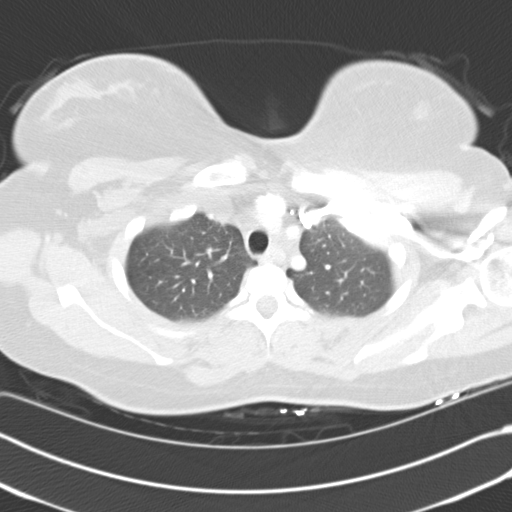

[Series 6: mpr coronal pe 3mm · coronal · 0.39mm/px · 3 of 79 slices shown]
[im 16/79  mediastinal]
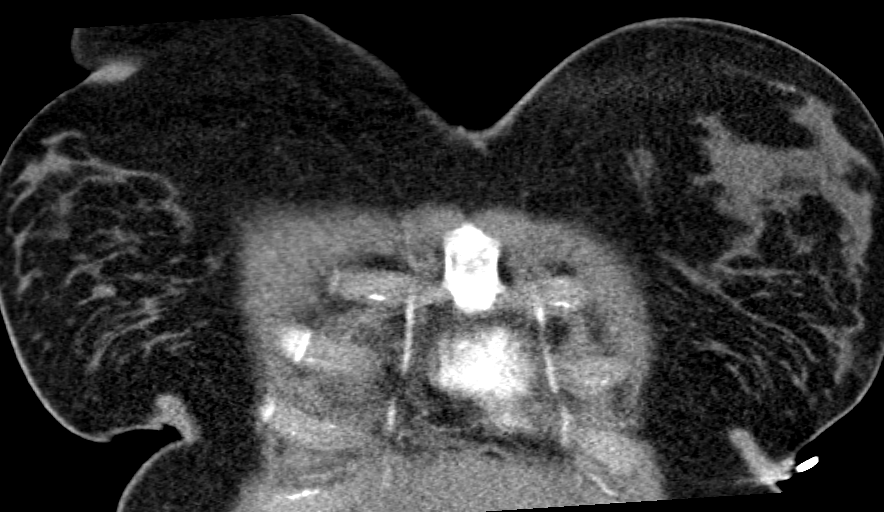
[im 32/79  mediastinal]
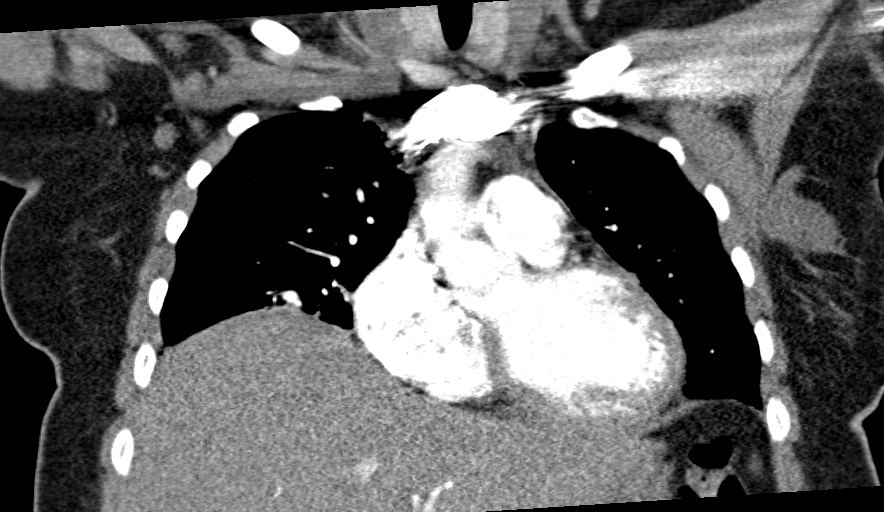
[im 47/79  mediastinal]
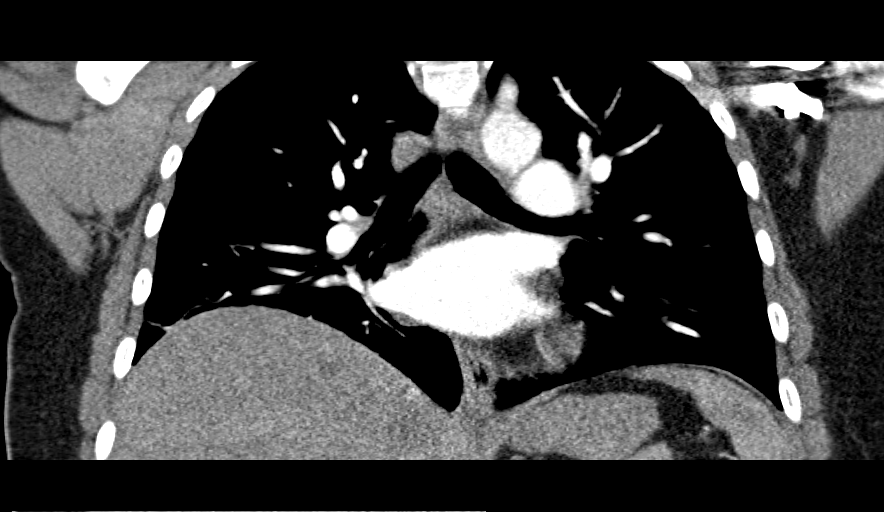

[8 of 36 positions shown; findings below may reference images not displayed]

FINDINGS: Sagittal images of the spine are unremarkable. Images of the
thoracic inlet are unremarkable. Central airways are patent. No
mediastinal hematoma or adenopathy. No aortic aneurysm. No pulmonary
embolus is identified. No destructive bony lesions are noted.

Images of the lung parenchyma shows no acute infiltrate or pleural
effusion. No pulmonary edema. No pulmonary nodules are noted. There
is no pneumothorax.

Visualized upper abdomen is unremarkable.  No axillary adenopathy.

Review of the MIP images confirms the above findings.
IMPRESSION: 1. No pulmonary embolus.
2. No acute infiltrate or pulmonary edema.
3. No mediastinal hematoma or adenopathy.

## 2015-04-14 IMAGING — CR DG CHEST 1V PORT
1 series · 1 of 1 positions shown · non-contrast
Comparison: Chest radiograph March 30, 2014

CLINICAL DATA: Right flank pain

EXAM:
PORTABLE CHEST - 1 VIEW

[portable]
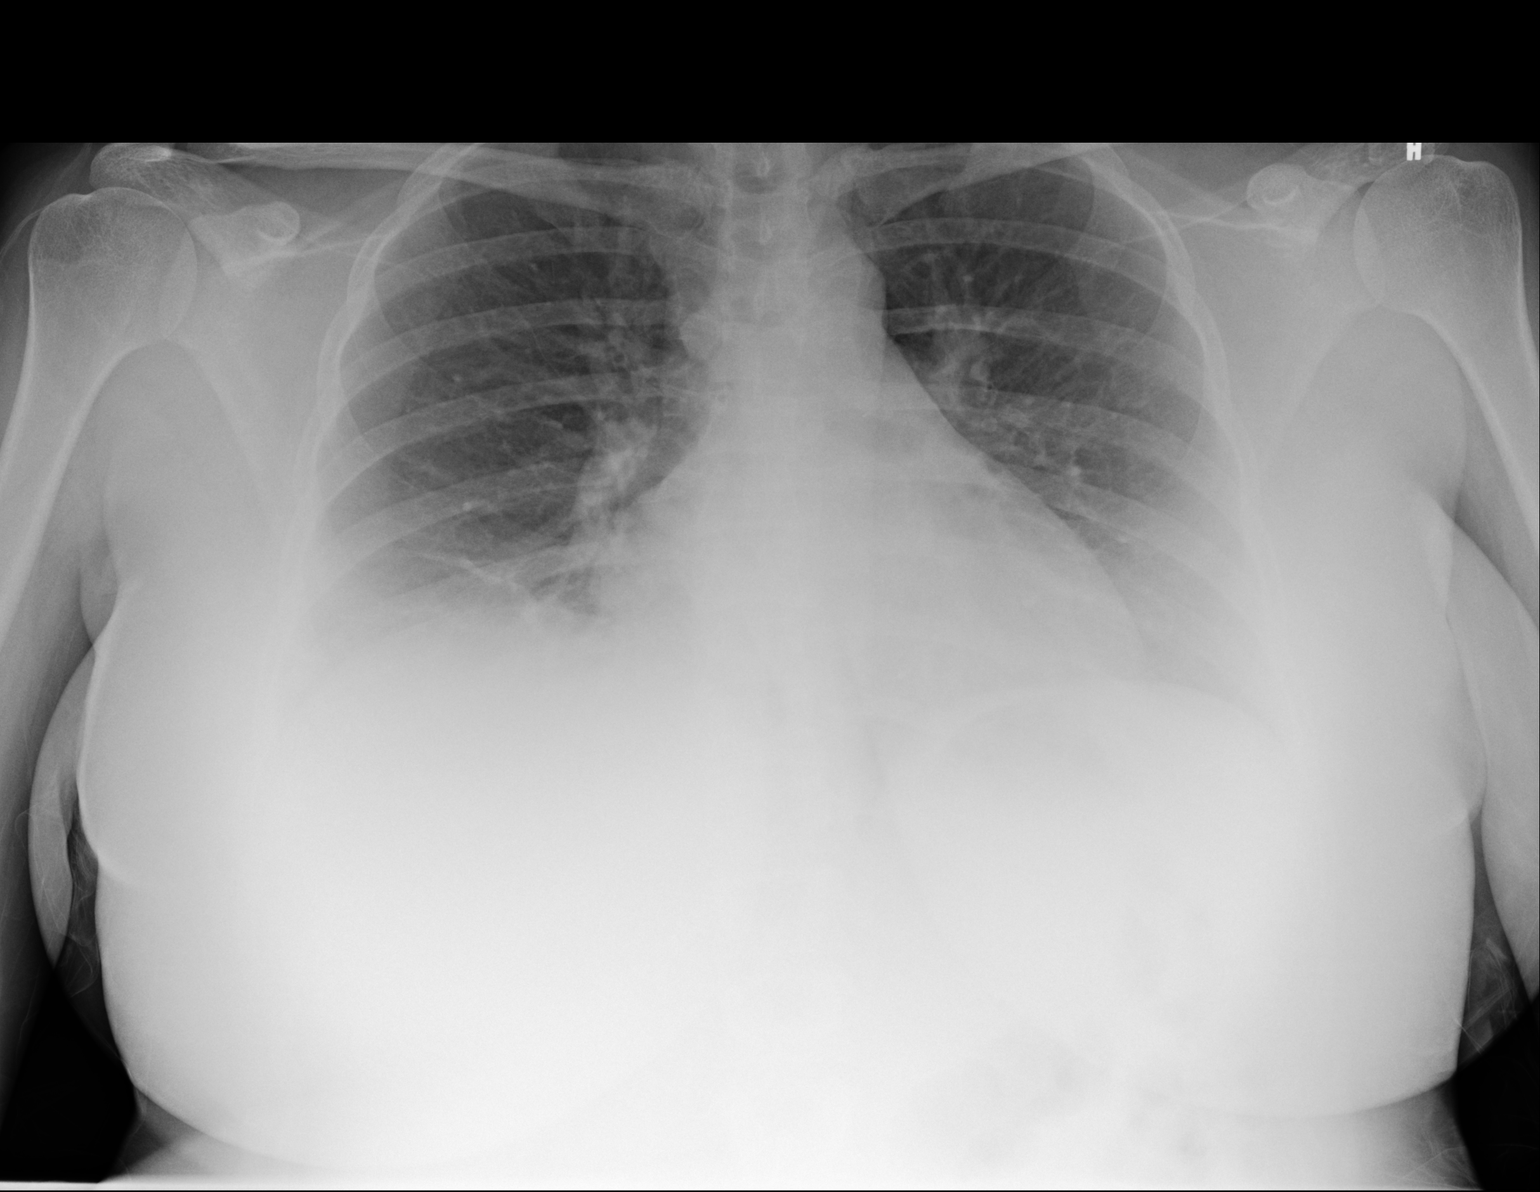

[1 of 1 positions shown; findings below may reference images not displayed]

FINDINGS: Cardiomediastinal silhouette is unremarkable for this low
inspiratory portable examination with crowded vasculature markings.
Curvilinear density right lung base. The lungs are clear without
pleural effusions or focal consolidations. Trachea projects midline
and there is no pneumothorax. Included soft tissue planes and
osseous structures are non-suspicious.
IMPRESSION: Right lung base atelectasis in this low inspiratory portable
examination.

  By: Jose Alberto Fortner

## 2015-04-17 ENCOUNTER — Encounter (HOSPITAL_COMMUNITY): Payer: Self-pay | Admitting: Emergency Medicine

## 2015-04-17 ENCOUNTER — Emergency Department (HOSPITAL_COMMUNITY)
Admission: EM | Admit: 2015-04-17 | Discharge: 2015-04-17 | Disposition: A | Discharge status: Home / Self Care | Payer: Medicaid Other | Attending: Emergency Medicine | Admitting: Emergency Medicine

## 2015-04-17 DIAGNOSIS — O9989 Other specified diseases and conditions complicating pregnancy, childbirth and the puerperium: Secondary | ICD-10-CM | POA: Insufficient documentation

## 2015-04-17 DIAGNOSIS — O98311 Other infections with a predominantly sexual mode of transmission complicating pregnancy, first trimester: Secondary | ICD-10-CM | POA: Insufficient documentation

## 2015-04-17 DIAGNOSIS — O99341 Other mental disorders complicating pregnancy, first trimester: Secondary | ICD-10-CM | POA: Insufficient documentation

## 2015-04-17 DIAGNOSIS — R109 Unspecified abdominal pain: Secondary | ICD-10-CM | POA: Diagnosis not present

## 2015-04-17 DIAGNOSIS — F909 Attention-deficit hyperactivity disorder, unspecified type: Secondary | ICD-10-CM | POA: Diagnosis not present

## 2015-04-17 DIAGNOSIS — A64 Unspecified sexually transmitted disease: Secondary | ICD-10-CM

## 2015-04-17 DIAGNOSIS — Z3A08 8 weeks gestation of pregnancy: Secondary | ICD-10-CM | POA: Insufficient documentation

## 2015-04-17 DIAGNOSIS — Z3490 Encounter for supervision of normal pregnancy, unspecified, unspecified trimester: Secondary | ICD-10-CM

## 2015-04-17 LAB — CBC
HCT: 38.2 % (ref 36.0–46.0)
Hemoglobin: 12.9 g/dL (ref 12.0–15.0)
MCH: 29.3 pg (ref 26.0–34.0)
MCHC: 33.8 g/dL (ref 30.0–36.0)
MCV: 86.6 fL (ref 78.0–100.0)
PLATELETS: 318 10*3/uL (ref 150–400)
RBC: 4.41 MIL/uL (ref 3.87–5.11)
RDW: 13.8 % (ref 11.5–15.5)
WBC: 11.9 10*3/uL — ABNORMAL HIGH (ref 4.0–10.5)

## 2015-04-17 LAB — COMPREHENSIVE METABOLIC PANEL
ALBUMIN: 3.7 g/dL (ref 3.5–5.0)
ALK PHOS: 79 U/L (ref 38–126)
ALT: 21 U/L (ref 14–54)
AST: 23 U/L (ref 15–41)
Anion gap: 9 (ref 5–15)
BUN: 5 mg/dL — ABNORMAL LOW (ref 6–20)
CALCIUM: 9.3 mg/dL (ref 8.9–10.3)
CHLORIDE: 104 mmol/L (ref 101–111)
CO2: 22 mmol/L (ref 22–32)
CREATININE: 0.66 mg/dL (ref 0.44–1.00)
GFR calc Af Amer: >60 mL/min (ref >60)
GFR calc non Af Amer: >60 mL/min (ref >60)
GLUCOSE: 83 mg/dL (ref 65–99)
Potassium: 3.5 mmol/L (ref 3.5–5.1)
SODIUM: 135 mmol/L (ref 135–145)
Total Bilirubin: 0.6 mg/dL (ref 0.3–1.2)
Total Protein: 6.8 g/dL (ref 6.5–8.1)

## 2015-04-17 LAB — URINALYSIS, ROUTINE W REFLEX MICROSCOPIC
Bilirubin Urine: NEGATIVE
Glucose, UA: NEGATIVE mg/dL
HGB URINE DIPSTICK: NEGATIVE
KETONES UR: 40 mg/dL — AB
Nitrite: NEGATIVE
PH: 5.5 (ref 5.0–8.0)
PROTEIN: NEGATIVE mg/dL
Specific Gravity, Urine: 1.027 (ref 1.005–1.030)
Urobilinogen, UA: 0.2 mg/dL (ref 0.0–1.0)

## 2015-04-17 LAB — WET PREP, GENITAL
CLUE CELLS WET PREP: NONE SEEN
TRICH WET PREP: NONE SEEN
Yeast Wet Prep HPF POC: NONE SEEN

## 2015-04-17 LAB — URINE MICROSCOPIC-ADD ON

## 2015-04-17 LAB — HCG, QUANTITATIVE, PREGNANCY: hCG, Beta Chain, Quant, S: 86770 m[IU]/mL — ABNORMAL HIGH (ref <5)

## 2015-04-17 LAB — LIPASE, BLOOD: LIPASE: 18 U/L — AB (ref 22–51)

## 2015-04-17 MED ORDER — ACETAMINOPHEN 500 MG PO TABS: 500.0000 mg | ORAL_TABLET | Freq: Four times a day (QID) | ORAL | Status: AC | PRN

## 2015-04-17 MED ORDER — SODIUM CHLORIDE 0.9 % IV BOLUS (SEPSIS)
1000.0000 mL | Freq: Once | INTRAVENOUS | Status: AC
  Administered 2015-04-17: 1000 mL via INTRAVENOUS

## 2015-04-17 MED ORDER — AZITHROMYCIN 250 MG PO TABS
1000.0000 mg | ORAL_TABLET | Freq: Once | ORAL | Status: AC
  Administered 2015-04-17: 1000 mg via ORAL
  Filled 2015-04-17: qty 4

## 2015-04-17 MED ORDER — DICYCLOMINE HCL 20 MG PO TABS: 20.0000 mg | ORAL_TABLET | Freq: Two times a day (BID) | ORAL | Status: AC

## 2015-04-17 MED ORDER — CEFTRIAXONE SODIUM 250 MG IJ SOLR
250.0000 mg | Freq: Once | INTRAMUSCULAR | Status: AC
  Administered 2015-04-17: 250 mg via INTRAMUSCULAR
  Filled 2015-04-17: qty 250

## 2015-04-17 MED ORDER — LIDOCAINE HCL (PF) 1 % IJ SOLN
5.0000 mL | Freq: Once | INTRAMUSCULAR | Status: AC
  Administered 2015-04-17: 5 mL
  Filled 2015-04-17: qty 5

## 2015-04-17 NOTE — ED Notes (Signed)
Educated pt to avoid sex for a week until antibiotics have fully managed infection, informed to contact her partners and follow up with OB. Pt left at this time with all belongings.

## 2015-04-17 NOTE — ED Provider Notes (Signed)
CSN: [id number]     Arrival date & time 04/17/15  1356 History   First MD Initiated Contact with Patient 04/17/15 1702     Chief Complaint  Patient presents with  . Abdominal Pain     (Consider location/radiation/quality/duration/timing/severity/associated sxs/prior Treatment) HPI  Sherry Vasquez is a 19 y.o. female 6-[redacted] weeks pregnant (second pregnancy) who presents with intermittent abdominal pain for the past 2 days with associated diarrhea. She states that the pain all over, intermittent and begin suddenly lasting 30 seconds to 1 minute. She states they feel comparable to contractions and very painful. Diarrhea is nonbloody and watery. She had 2 episodes today, and 5 yesterday. Denies fevers, chest pain, shortness of breath, urinary symptoms. Last menstrual period was the end of July. She has an early ultrasound scheduled on Monday with her PCP.  Pt states that she tested positive for chlamydia at her PCP office, and just found out.     Past Medical History  Diagnosis Date  . ADHD (attention deficit hyperactivity disorder) 11/05/2012  . Renal disorder     born with one kidney   . Congenital absence of one kidney    History reviewed. No pertinent past surgical history. Family History  Problem Relation Age of Onset  . Hyperlipidemia Father   . ADD / ADHD Brother   . ADD / ADHD Sister   . ADD / ADHD Cousin   . Drug abuse Mother    Social History  Substance Use Topics  . Smoking status: Former Smoker    Types: Cigarettes  . Smokeless tobacco: Never Used  . Alcohol Use: No   OB History    Gravida Para Term Preterm AB TAB SAB Ectopic Multiple Living   1              Review of Systems  Respiratory: Negative.   Cardiovascular: Negative.   Gastrointestinal: Positive for nausea, vomiting, abdominal pain and diarrhea. Negative for blood in stool.  Genitourinary: Negative.   Neurological: Negative.       Allergies  Review of patient's allergies indicates no known  allergies.  Home Medications   Prior to Admission medications   Medication Sig Start Date End Date Taking? Authorizing Provider  ibuprofen (ADVIL,MOTRIN) 800 MG tablet Take 1 tablet (800 mg total) by mouth 3 (three) times daily. Patient taking differently: Take 800 mg by mouth 3 (three) times daily as needed.  01/06/15   Ivery Quale, PA-C  lisdexamfetamine (VYVANSE) 70 MG capsule Take 1 capsule (70 mg total) by mouth daily. 03/27/15   Myrlene Broker, MD  lisdexamfetamine (VYVANSE) 70 MG capsule Take 1 capsule (70 mg total) by mouth daily. 03/27/15   Myrlene Broker, MD  lisdexamfetamine (VYVANSE) 70 MG capsule Take 1 capsule (70 mg total) by mouth daily. 03/27/15   Myrlene Broker, MD  methocarbamol (ROBAXIN) 500 MG tablet Take 1 tablet (500 mg total) by mouth 3 (three) times daily. Patient taking differently: Take 500 mg by mouth 3 (three) times daily as needed.  01/06/15   Ivery Quale, PA-C  norgestimate-ethinyl estradiol (ORTHO-CYCLEN,SPRINTEC,PREVIFEM) 0.25-35 MG-MCG tablet Take 1 tablet by mouth daily. 12/05/14   Historical Provider, MD   BP 105/58 mmHg  Pulse 80  Temp(Src) 98.2 F (36.8 C) (Oral)  Resp 14  Ht 5\' 4"  (1.626 m)  Wt 189 lb (85.73 kg)  BMI 32.43 kg/m2  SpO2 100%  LMP 02/26/2015 Physical Exam  Constitutional: She is oriented to person, place, and time. She appears well-developed and  well-nourished.  HENT:  Head: Normocephalic and atraumatic.  Mouth/Throat: Oropharynx is clear and moist.  Eyes: Pupils are equal, round, and reactive to light.  Neck: Normal range of motion. Neck supple.  Cardiovascular: Normal rate, regular rhythm and normal heart sounds.   No murmur heard. Pulmonary/Chest: Effort normal and breath sounds normal. No respiratory distress. She has no wheezes. She has no rales.  Abdominal: Soft. Bowel sounds are normal. She exhibits no distension. There is no tenderness.  Genitourinary: Pelvic exam was performed with patient prone. Uterus is enlarged. Uterus  is not tender. Cervix exhibits discharge. Cervix exhibits no motion tenderness. Right adnexum displays no mass and no tenderness. Left adnexum displays no mass and no tenderness. No bleeding in the vagina. Vaginal discharge found.  Musculoskeletal: Normal range of motion.  Lymphadenopathy:    She has no cervical adenopathy.  Neurological: She is alert and oriented to person, place, and time.  Skin: Skin is warm and dry.  Psychiatric: She has a normal mood and affect. Her behavior is normal.    ED Course  Procedures (including critical care time) Labs Review Labs Reviewed  LIPASE, BLOOD - Abnormal; Notable for the following:    Lipase 18 (*)    All other components within normal limits  COMPREHENSIVE METABOLIC PANEL - Abnormal; Notable for the following:    BUN 5 (*)    All other components within normal limits  CBC - Abnormal; Notable for the following:    WBC 11.9 (*)    All other components within normal limits  HCG, QUANTITATIVE, PREGNANCY - Abnormal; Notable for the following:    hCG, Beta Chain, Quant, [place] [address] (*)    All other components within normal limits  WET PREP, GENITAL  URINALYSIS, ROUTINE W REFLEX MICROSCOPIC (NOT AT [facility])  RPR  HIV ANTIBODY (ROUTINE TESTING)  GC/CHLAMYDIA PROBE AMP ([facility]) NOT AT [facility]    Imaging Review No results found. I have personally reviewed and evaluated these images and lab results as part of my medical decision-making.   EKG Interpretation None      MDM   Final diagnoses:  None    Patient presents with intermittent lower abdominal pain and positive chlamydia result.  VSS, patient appears nontoxic, NAD.  On exam, cervical discharge present.  No abdominal tenderness, no adnexal tenderness.  No vaginal bleeding.   Suspect cervicitis secondary to STD.  Low suspicion for ectopic pregnancy, threatened abortion, or ruptured ectopic.   Imaging not performed.  Labs hCG, lipase, CMP, CBC, UA.   Pt given azithromycin and rocephin  in ED.  Suspect cervicitis. Pt stable for d/c.  D/c home with tylenol and Bentyl. Advised to follow up in 1 days with PCP.  Discussed return precautions and supportive care.  Patient acknowledges and agrees with the above plan.       Cheri Fowler, PA-C 04/17/15 1948  Arby Barrette, MD 04/18/15 2138

## 2015-04-17 NOTE — Discharge Instructions (Signed)
Abdominal Pain During Pregnancy Abdominal pain is common in pregnancy. Most of the time, it does not cause harm. There are many causes of abdominal pain. Some causes are more serious than others. Some of the causes of abdominal pain in pregnancy are easily diagnosed. Occasionally, the diagnosis takes time to understand. Other times, the cause is not determined. Abdominal pain can be a sign that something is very wrong with the pregnancy, or the pain may have nothing to do with the pregnancy at all. For this reason, always tell your health care provider if you have any abdominal discomfort. HOME CARE INSTRUCTIONS  Monitor your abdominal pain for any changes. The following actions may help to alleviate any discomfort you are experiencing:  Do not have sexual intercourse or put anything in your vagina until your symptoms go away completely.  Get plenty of rest until your pain improves.  Drink clear fluids if you feel nauseous. Avoid solid food as long as you are uncomfortable or nauseous.  Only take over-the-counter or prescription medicine as directed by your health care provider.  Keep all follow-up appointments with your health care provider. SEEK IMMEDIATE MEDICAL CARE IF:  You are bleeding, leaking fluid, or passing tissue from the vagina.  You have increasing pain or cramping.  You have persistent vomiting.  You have painful or bloody urination.  You have a fever.  You notice a decrease in your baby's movements.  You have extreme weakness or feel faint.  You have shortness of breath, with or without abdominal pain.  You develop a severe headache with abdominal pain.  You have abnormal vaginal discharge with abdominal pain.  You have persistent diarrhea.  You have abdominal pain that continues even after rest, or gets worse. MAKE SURE YOU:   Understand these instructions.  Will watch your condition.  Will get help right away if you are not doing well or get  worse. Document Released: 07/18/2005 Document Revised: 05/08/2013 Document Reviewed: 02/14/2013 [facility] Patient Information 2015 [place], [place]. This information is not intended to replace advice given to you by your health care provider. Make sure you discuss any questions you have with your health care provider.   Sexually Transmitted Disease A sexually transmitted disease (STD) is a disease or infection that may be passed (transmitted) from person to person, usually during sexual activity. This may happen by way of saliva, semen, blood, vaginal mucus, or urine. Common STDs include:   Gonorrhea.   Chlamydia.   Syphilis.   HIV and AIDS.   Genital herpes.   Hepatitis B and C.   Trichomonas.   Human papillomavirus (HPV).   Pubic lice.   Scabies.  Mites.  Bacterial vaginosis. WHAT ARE CAUSES OF STDs? An STD may be caused by bacteria, a virus, or parasites. STDs are often transmitted during sexual activity if one person is infected. However, they may also be transmitted through nonsexual means. STDs may be transmitted after:   Sexual intercourse with an infected person.   Sharing sex toys with an infected person.   Sharing needles with an infected person or using unclean piercing or tattoo needles.  Having intimate contact with the genitals, mouth, or rectal areas of an infected person.   Exposure to infected fluids during birth. WHAT ARE THE SIGNS AND SYMPTOMS OF STDs? Different STDs have different symptoms. Some people may not have any symptoms. If symptoms are present, they may include:   Painful or bloody urination.   Pain in the pelvis, abdomen, vagina, anus, throat,  or eyes.   A skin rash, itching, or irritation.  Growths, ulcerations, blisters, or sores in the genital and anal areas.  Abnormal vaginal discharge with or without bad odor.   Penile discharge in men.   Fever.   Pain or bleeding during sexual intercourse.   Swollen  glands in the groin area.   Yellow skin and eyes (jaundice). This is seen with hepatitis.   Swollen testicles.  Infertility.  Sores and blisters in the mouth. HOW ARE STDs DIAGNOSED? To make a diagnosis, your health care provider may:   Take a medical history.   Perform a physical exam.   Take a sample of any discharge to examine.  Swab the throat, cervix, opening to the penis, rectum, or vagina for testing.  Test a sample of your first morning urine.   Perform blood tests.   Perform a Pap test, if this applies.   Perform a colposcopy.   Perform a laparoscopy.  HOW ARE STDs TREATED? Treatment depends on the STD. Some STDs may be treated but not cured.   Chlamydia, gonorrhea, trichomonas, and syphilis can be cured with antibiotic medicine.   Genital herpes, hepatitis, and HIV can be treated, but not cured, with prescribed medicines. The medicines lessen symptoms.   Genital warts from HPV can be treated with medicine or by freezing, burning (electrocautery), or surgery. Warts may come back.   HPV cannot be cured with medicine or surgery. However, abnormal areas may be removed from the cervix, vagina, or vulva.   If your diagnosis is confirmed, your recent sexual partners need treatment. This is true even if they are symptom-free or have a negative culture or evaluation. They should not have sex until their health care providers say it is okay. HOW CAN I REDUCE MY RISK OF GETTING AN STD? Take these steps to reduce your risk of getting an STD:  Use latex condoms, dental dams, and water-soluble lubricants during sexual activity. Do not use petroleum jelly or oils.  Avoid having multiple sex partners.  Do not have sex with someone who has other sex partners.  Do not have sex with anyone you do not know or who is at high risk for an STD.  Avoid risky sex practices that can break your skin.  Do not have sex if you have open sores on your mouth or  skin.  Avoid drinking too much alcohol or taking illegal drugs. Alcohol and drugs can affect your judgment and put you in a vulnerable position.  Avoid engaging in oral and anal sex acts.  Get vaccinated for HPV and hepatitis. If you have not received these vaccines in the past, talk to your health care provider about whether one or both might be right for you.   If you are at risk of being infected with HIV, it is recommended that you take a prescription medicine daily to prevent HIV infection. This is called pre-exposure prophylaxis (PrEP). You are considered at risk if:  You are a man who has sex with other men (MSM).  You are a heterosexual man or woman and are sexually active with more than one partner.  You take drugs by injection.  You are sexually active with a partner who has HIV.  Talk with your health care provider about whether you are at high risk of being infected with HIV. If you choose to begin PrEP, you should first be tested for HIV. You should then be tested every 3 months for as long as you  are taking PrEP.  WHAT SHOULD I DO IF I THINK I HAVE AN STD?  See your health care provider.   Tell your sexual partner(s). They should be tested and treated for any STDs.  Do not have sex until your health care provider says it is okay. WHEN SHOULD I GET IMMEDIATE MEDICAL CARE? Contact your health care provider right away if:   You have severe abdominal pain.  You are a man and notice swelling or pain in your testicles.  You are a woman and notice swelling or pain in your vagina. Document Released: 10/08/2002 Document Revised: 07/23/2013 Document Reviewed: 02/05/2013 [facility] Patient Information 2015 [place], [place]. This information is not intended to replace advice given to you by your health care provider. Make sure you discuss any questions you have with your health care provider.

## 2015-04-17 NOTE — ED Notes (Signed)
Patient states she is 6-[redacted] weeks pregnant; second pregnancy (no problems with 1st pregnancy). Patient states she has had abdominal pain/cramping x2 days. Patient states she has had diarrhea. Denies vaginal bleeding.

## 2015-04-17 NOTE — ED Notes (Signed)
Pt educated on sexually transmitted diseases and informed not to sleep with her husband or any other partner until 7 days following that person's tx.

## 2015-04-18 LAB — RPR: RPR: NONREACTIVE

## 2015-04-18 LAB — HIV ANTIBODY (ROUTINE TESTING W REFLEX): HIV Screen 4th Generation wRfx: NONREACTIVE

## 2015-04-20 LAB — GC/CHLAMYDIA PROBE AMP (~~LOC~~) NOT AT ARMC
CHLAMYDIA, DNA PROBE: POSITIVE — AB
Neisseria Gonorrhea: NEGATIVE

## 2015-04-21 ENCOUNTER — Telehealth (HOSPITAL_COMMUNITY): Payer: Self-pay

## 2015-04-21 NOTE — Telephone Encounter (Signed)
Positive for chlamydia. Treated per protocol. [employer] form faxed. Attempting to contact.  

## 2015-04-22 ENCOUNTER — Telehealth (HOSPITAL_COMMUNITY): Payer: Self-pay | Admitting: Emergency Medicine

## 2015-04-22 NOTE — Telephone Encounter (Signed)
ID verified x three, patient notified of positive Chlamydia and that treatment was given while in ED. STD instructions given, patient verbalized understanding.

## 2015-06-19 ENCOUNTER — Ambulatory Visit (HOSPITAL_COMMUNITY): Payer: Self-pay | Admitting: Psychiatry

## 2015-06-22 ENCOUNTER — Encounter (HOSPITAL_COMMUNITY): Payer: Self-pay | Admitting: Psychiatry

## 2019-05-28 ENCOUNTER — Other Ambulatory Visit: Payer: Self-pay | Admitting: *Deleted

## 2019-05-28 DIAGNOSIS — Z20822 Contact with and (suspected) exposure to covid-19: Secondary | ICD-10-CM

## 2019-05-29 ENCOUNTER — Telehealth: Payer: Self-pay

## 2019-05-29 NOTE — Telephone Encounter (Signed)
Patient called for her COVID-19 test result.  She was informed that the test was active but had not resulted.  She will call back tomorrow.

## 2019-05-30 ENCOUNTER — Telehealth: Payer: Self-pay | Admitting: *Deleted

## 2019-05-30 ENCOUNTER — Encounter: Payer: Self-pay | Admitting: *Deleted

## 2019-05-30 LAB — NOVEL CORONAVIRUS, NAA: SARS-CoV-2, NAA: NOT DETECTED

## 2019-05-30 NOTE — Telephone Encounter (Signed)
Results not available as of yet. Sent MyChart code for patient to sign up.

## 2019-05-30 NOTE — Telephone Encounter (Signed)
MyChart activation code sent via text.

## 2024-02-14 ENCOUNTER — Emergency Department (HOSPITAL_COMMUNITY)
Admission: EM | Admit: 2024-02-14 | Discharge: 2024-02-14 | Disposition: A | Discharge status: Home / Self Care | Payer: Self-pay | Attending: Emergency Medicine | Admitting: Emergency Medicine

## 2024-02-14 ENCOUNTER — Encounter (HOSPITAL_COMMUNITY): Payer: Self-pay | Admitting: Emergency Medicine

## 2024-02-14 ENCOUNTER — Other Ambulatory Visit: Payer: Self-pay

## 2024-02-14 DIAGNOSIS — N760 Acute vaginitis: Secondary | ICD-10-CM | POA: Insufficient documentation

## 2024-02-14 DIAGNOSIS — B9689 Other specified bacterial agents as the cause of diseases classified elsewhere: Secondary | ICD-10-CM | POA: Insufficient documentation

## 2024-02-14 DIAGNOSIS — N939 Abnormal uterine and vaginal bleeding, unspecified: Secondary | ICD-10-CM

## 2024-02-14 HISTORY — DX: Disorder of thyroid, unspecified: E07.9

## 2024-02-14 LAB — URINALYSIS, ROUTINE W REFLEX MICROSCOPIC
Bacteria, UA: NONE SEEN
Bilirubin Urine: NEGATIVE
Glucose, UA: NEGATIVE mg/dL
Ketones, ur: NEGATIVE mg/dL
Leukocytes,Ua: NEGATIVE
Nitrite: NEGATIVE
Protein, ur: 30 mg/dL — AB
Specific Gravity, Urine: 1.016 (ref 1.005–1.030)
pH: 6 (ref 5.0–8.0)

## 2024-02-14 LAB — WET PREP, GENITAL
Sperm: NONE SEEN
Trich, Wet Prep: NONE SEEN
WBC, Wet Prep HPF POC: <10 (ref <10)
Yeast Wet Prep HPF POC: NONE SEEN

## 2024-02-14 LAB — CBC
HCT: 39.7 % (ref 36.0–46.0)
Hemoglobin: 13.2 g/dL (ref 12.0–15.0)
MCH: 30.2 pg (ref 26.0–34.0)
MCHC: 33.2 g/dL (ref 30.0–36.0)
MCV: 90.8 fL (ref 80.0–100.0)
Platelets: 392 K/uL (ref 150–400)
RBC: 4.37 MIL/uL (ref 3.87–5.11)
RDW: 12.1 % (ref 11.5–15.5)
WBC: 6.9 K/uL (ref 4.0–10.5)
nRBC: 0 % (ref 0.0–0.2)

## 2024-02-14 LAB — COMPREHENSIVE METABOLIC PANEL WITH GFR
ALT: 14 U/L (ref 0–44)
AST: 17 U/L (ref 15–41)
Albumin: 3.9 g/dL (ref 3.5–5.0)
Alkaline Phosphatase: 78 U/L (ref 38–126)
Anion gap: 11 (ref 5–15)
BUN: 11 mg/dL (ref 6–20)
CO2: 26 mmol/L (ref 22–32)
Calcium: 9.1 mg/dL (ref 8.9–10.3)
Chloride: 98 mmol/L (ref 98–111)
Creatinine, Ser: 0.78 mg/dL (ref 0.44–1.00)
GFR, Estimated: >60 mL/min (ref >60)
Glucose, Bld: 90 mg/dL (ref 70–99)
Potassium: 4 mmol/L (ref 3.5–5.1)
Sodium: 135 mmol/L (ref 135–145)
Total Bilirubin: 0.5 mg/dL (ref 0.0–1.2)
Total Protein: 7.8 g/dL (ref 6.5–8.1)

## 2024-02-14 LAB — HCG, QUANTITATIVE, PREGNANCY: hCG, Beta Chain, Quant, S: <1 m[IU]/mL (ref <5)

## 2024-02-14 LAB — POC URINE PREG, ED: Preg Test, Ur: NEGATIVE

## 2024-02-14 MED ORDER — METRONIDAZOLE 500 MG PO TABS: 500.0000 mg | ORAL_TABLET | Freq: Two times a day (BID) | ORAL | 0 refills | Status: AC

## 2024-02-14 NOTE — ED Triage Notes (Signed)
 Pt c/o vaginal bleeding and cramping that started on Friday. Pt states that she was passing blood clots and going through a pad an hour, but has lightened since yesterday. Unknown if she was pregnant, LMP was x 2 months ago.

## 2024-02-14 NOTE — ED Notes (Addendum)
 Called lab regarding hCG since it was drawn 2 hours ago. They said that the machine is down for maintenance  and will be able to be ran in around 5-10 minutes.

## 2024-02-14 NOTE — ED Notes (Signed)
 Patient ready to go; vs not taken. Ambulatory to lobby.

## 2024-02-14 NOTE — ED Notes (Signed)
 Pelvis supplies at bedside

## 2024-02-14 NOTE — ED Provider Notes (Signed)
 [place] EMERGENCY DEPARTMENT AT [facility] Provider Note   CSN: [id number] Arrival date & time: 02/14/24  1633     Patient presents with: Vaginal Bleeding   Sherry Vasquez is a 28 y.o. female.  She is G3 P3 with history of ADHD, congenital absence of 1 kidney.  Presents the ER today for evaluation of heavy vaginal bleeding.  She states that about 5 days ago she just started having nausea and vomiting and vomited about 7 or 8 times over 2 to 3 days, no longer having nausea or vomiting but states 4 days ago started having heavy vaginal bleeding with blood clots going through more than a pad an hour for the first day, this is improved but she still having vaginal bleeding.  She notes today that she realized that she thinks she missed her period last month was worried about possibly being pregnant when she was googling her symptoms so she presents to the ER for further evaluation.  No dizziness, no fever or chills.    Vaginal Bleeding      Prior to Admission medications   Medication Sig Start Date End Date Taking? Authorizing Provider  metroNIDAZOLE  (FLAGYL ) 500 MG tablet Take 1 tablet (500 mg total) by mouth 2 (two) times daily. 02/14/24  Yes Dejane Scheibe A, PA-C  acetaminophen  (TYLENOL ) 500 MG tablet Take 1 tablet (500 mg total) by mouth every 6 (six) hours as needed. 04/17/15   Rose, Kayla, PA-C  buprenorphine-naloxone (SUBOXONE) 8-2 mg SUBL SL tablet Place 1 tablet under the tongue 2 (two) times daily.    [provider]  busPIRone (BUSPAR) 5 MG tablet Take 5 mg by mouth 2 (two) times daily. 02/07/24   [provider]  dicyclomine  (BENTYL ) 20 MG tablet Take 1 tablet (20 mg total) by mouth 2 (two) times daily. 04/17/15   Rose, Kayla, PA-C  levothyroxine (SYNTHROID) 75 MCG tablet Take 75 mcg by mouth daily.    [provider]  Prenat MV-Min-Methylfolate-FA (PRENATE) 0.6-0.4 MG CHEW Chew 1 tablet by mouth daily. 04/13/15   [provider]   sertraline (ZOLOFT) 25 MG tablet Take 25 mg by mouth daily. 01/17/24   [provider]    Allergies: Tramadol     Review of Systems  Genitourinary:  Positive for vaginal bleeding.    Updated Vital Signs BP 120/86 (BP Location: Right Arm)   Pulse 76   Temp 98.9 F (37.2 C) (Oral)   Resp 20   Ht 5' (1.524 m)   Wt 93 kg   LMP 12/13/2023   SpO2 100%   BMI 40.04 kg/m   Physical Exam Vitals and nursing note reviewed. Exam conducted with a chaperone present.  Constitutional:      General: She is not in acute distress.    Appearance: She is well-developed.  HENT:     Head: Normocephalic and atraumatic.  Eyes:     Conjunctiva/sclera: Conjunctivae normal.  Cardiovascular:     Rate and Rhythm: Normal rate and regular rhythm.     Heart sounds: No murmur heard. Pulmonary:     Effort: Pulmonary effort is normal. No respiratory distress.     Breath sounds: Normal breath sounds.  Abdominal:     Palpations: Abdomen is soft.     Tenderness: There is no abdominal tenderness.  Genitourinary:    General: Normal vulva.     Cervix: Normal. No cervical motion tenderness, friability, lesion or erythema.     Uterus: Normal.  Adnexa: Right adnexa normal and left adnexa normal.     Comments: Small amount of blood in vaginal vault, no active bleeding, Musculoskeletal:        General: No swelling.     Cervical back: Neck supple.  Skin:    General: Skin is warm and dry.     Capillary Refill: Capillary refill takes less than 2 seconds.  Neurological:     Mental Status: She is alert.  Psychiatric:        Mood and Affect: Mood normal.     (all labs ordered are listed, but only abnormal results are displayed) Labs Reviewed  WET PREP, GENITAL - Abnormal; Notable for the following components:      Result Value   Clue Cells Wet Prep HPF POC PRESENT (*)    All other components within normal limits  URINALYSIS, ROUTINE W REFLEX MICROSCOPIC - Abnormal; Notable for the following  components:   APPearance HAZY (*)    Hgb urine dipstick LARGE (*)    Protein, ur 30 (*)    All other components within normal limits  POC URINE PREG, ED - Normal  CBC  HCG, QUANTITATIVE, PREGNANCY  COMPREHENSIVE METABOLIC PANEL WITH GFR  GC/CHLAMYDIA PROBE AMP ([place]) NOT AT [facility]    EKG: None  Radiology: No results found.   Procedures   Medications Ordered in the ED - No data to display                                  Medical Decision Making Diagnosis includes but limited to dysfunctional uterine bleeding, miscarriage, menstrual period, gastroenteritis, ovarian cyst, UTI, PID, other  ED course: Patient here for vaginal bleeding that started 4 days ago after she had had nausea vomiting as well.  Nausea vomiting has resolved and she is able to tolerate clear fluids for the past couple of days.  She is not pregnant, she does not have a UTI but does have blood in her urine likely secondary to vaginal bleeding.  Her abdominal exam is reassuring I do not suspect ovarian torsion given her presentation of vague crampy lower abdominal pain.  Her bleeding is fortunately slowed down and her hemoglobin was normal at 13.  She was previously worried today wanted to make sure she was not pregnant and having a miscarriage or other complications with pregnancy since she thinks she missed her period last month.  She was reassured that she is not pregnant, blood hCG was less than 1.  She is not hypotensive or tachycardic.  No significant pelvic tenderness.  States she gets some cramping that feels like menstrual cramps but more severe.  Advised on OTC medicine and follow-up with OB/GYN.  Asking to be discharged at this time.  Wet mount was positive for clue cells will treat for BV.  She requested a work note for today as well as the past 2 days that she had to call out due to vomiting and vaginal bleeding.,  Discussed with her we can give her note going forward but not retroactively  Amount  and/or Complexity of Data Reviewed Labs: ordered.  Risk Prescription drug management.        Final diagnoses:  Vaginal bleeding  BV (bacterial vaginosis)    ED Discharge Orders          Ordered    metroNIDAZOLE  (FLAGYL ) 500 MG tablet  2 times daily  02/14/24 2254               Suellen Sherran LABOR, PA-C 02/14/24 2321    Francesca Elsie CROME, MD 02/15/24 [id number]

## 2024-02-14 NOTE — Discharge Instructions (Signed)
 It was a pleasure taking care of you, you are seen in the ER for abdominal cramping and vaginal bleeding, your hemoglobin is normal, you are not pregnant, your kidney function and liver functions are all normal, we did a vaginal exam, fortunately your bleeding is very mild at this time, I you do have clue cells which is a sign of bacterial vaginosis so we will treat with antibiotics.  We checked for GC and chlamydia, you will receive call for positive results, though should follow-up with your MyChart as well.  Please follow-up with OB/GYN.  Come back to the ER if you have new or worsening symptoms especially severe bleeding, dizziness, severe pain or other worrisome changes.

## 2024-02-16 LAB — GC/CHLAMYDIA PROBE AMP (~~LOC~~) NOT AT ARMC
Chlamydia: NEGATIVE
Comment: NEGATIVE
Comment: NORMAL
Neisseria Gonorrhea: NEGATIVE
